# Patient Record
Sex: Female | Born: 1985 | Hispanic: Yes | Smoking: Former smoker
Health system: Southern US, Community
[De-identification: ages and names within clinical notes are randomized; demographics above are authoritative.]

## PROBLEM LIST (undated history)

## (undated) DIAGNOSIS — M2550 Pain in unspecified joint: Secondary | ICD-10-CM

## (undated) DIAGNOSIS — F909 Attention-deficit hyperactivity disorder, unspecified type: Secondary | ICD-10-CM

## (undated) DIAGNOSIS — M24859 Other specific joint derangements of unspecified hip, not elsewhere classified: Secondary | ICD-10-CM

## (undated) DIAGNOSIS — I10 Essential (primary) hypertension: Secondary | ICD-10-CM

## (undated) DIAGNOSIS — J45909 Unspecified asthma, uncomplicated: Secondary | ICD-10-CM

## (undated) DIAGNOSIS — E119 Type 2 diabetes mellitus without complications: Secondary | ICD-10-CM

## (undated) DIAGNOSIS — F32A Depression, unspecified: Secondary | ICD-10-CM

## (undated) DIAGNOSIS — E559 Vitamin D deficiency, unspecified: Secondary | ICD-10-CM

## (undated) HISTORY — DX: Other specific joint derangements of unspecified hip, not elsewhere classified: M24.859

## (undated) HISTORY — DX: Unspecified asthma, uncomplicated: J45.909

## (undated) HISTORY — DX: Pain in unspecified joint: M25.50

## (undated) HISTORY — DX: Essential (primary) hypertension: I10

## (undated) HISTORY — DX: Depression, unspecified: F32.A

## (undated) HISTORY — DX: Type 2 diabetes mellitus without complications: E11.9

## (undated) HISTORY — DX: Vitamin D deficiency, unspecified: E55.9

## (undated) HISTORY — PX: WISDOM TOOTH EXTRACTION: SHX21

## (undated) HISTORY — DX: Attention-deficit hyperactivity disorder, unspecified type: F90.9

---

## 2011-11-27 ENCOUNTER — Ambulatory Visit (INDEPENDENT_AMBULATORY_CARE_PROVIDER_SITE_OTHER): Payer: Managed Care, Other (non HMO)

## 2011-11-27 DIAGNOSIS — IMO0002 Reserved for concepts with insufficient information to code with codable children: Secondary | ICD-10-CM

## 2011-11-27 DIAGNOSIS — M79609 Pain in unspecified limb: Secondary | ICD-10-CM

## 2012-07-01 ENCOUNTER — Emergency Department (HOSPITAL_BASED_OUTPATIENT_CLINIC_OR_DEPARTMENT_OTHER)
Admission: EM | Admit: 2012-07-01 | Discharge: 2012-07-02 | Disposition: A | Discharge status: Home / Self Care | Payer: Managed Care, Other (non HMO) | Attending: Emergency Medicine | Admitting: Emergency Medicine

## 2012-07-01 ENCOUNTER — Emergency Department (HOSPITAL_BASED_OUTPATIENT_CLINIC_OR_DEPARTMENT_OTHER): Payer: Managed Care, Other (non HMO)

## 2012-07-01 ENCOUNTER — Encounter (HOSPITAL_BASED_OUTPATIENT_CLINIC_OR_DEPARTMENT_OTHER): Payer: Self-pay | Admitting: *Deleted

## 2012-07-01 DIAGNOSIS — IMO0002 Reserved for concepts with insufficient information to code with codable children: Secondary | ICD-10-CM | POA: Insufficient documentation

## 2012-07-01 DIAGNOSIS — R0602 Shortness of breath: Secondary | ICD-10-CM | POA: Insufficient documentation

## 2012-07-01 DIAGNOSIS — R071 Chest pain on breathing: Secondary | ICD-10-CM | POA: Insufficient documentation

## 2012-07-01 DIAGNOSIS — X58XXXA Exposure to other specified factors, initial encounter: Secondary | ICD-10-CM | POA: Insufficient documentation

## 2012-07-01 DIAGNOSIS — R079 Chest pain, unspecified: Secondary | ICD-10-CM | POA: Insufficient documentation

## 2012-07-01 DIAGNOSIS — S29011A Strain of muscle and tendon of front wall of thorax, initial encounter: Secondary | ICD-10-CM

## 2012-07-01 DIAGNOSIS — Z87891 Personal history of nicotine dependence: Secondary | ICD-10-CM | POA: Insufficient documentation

## 2012-07-01 LAB — URINALYSIS, ROUTINE W REFLEX MICROSCOPIC
Bilirubin Urine: NEGATIVE
Hgb urine dipstick: NEGATIVE
Ketones, ur: NEGATIVE mg/dL
Nitrite: NEGATIVE
Specific Gravity, Urine: 1.026 (ref 1.005–1.030)
pH: 7.5 (ref 5.0–8.0)

## 2012-07-01 LAB — BASIC METABOLIC PANEL
BUN: 10 mg/dL (ref 6–23)
CO2: 24 mEq/L (ref 19–32)
Calcium: 10 mg/dL (ref 8.4–10.5)
GFR calc non Af Amer: >90 mL/min (ref >90)
Glucose, Bld: 84 mg/dL (ref 70–99)
Sodium: 137 mEq/L (ref 135–145)

## 2012-07-01 LAB — CBC WITH DIFFERENTIAL/PLATELET
Eosinophils Absolute: 0.2 10*3/uL (ref 0.0–0.7)
Eosinophils Relative: 2 % (ref 0–5)
HCT: 37.4 % (ref 36.0–46.0)
Hemoglobin: 13 g/dL (ref 12.0–15.0)
Lymphocytes Relative: 30 % (ref 12–46)
Lymphs Abs: 3.3 10*3/uL (ref 0.7–4.0)
MCH: 28.1 pg (ref 26.0–34.0)
MCV: 81 fL (ref 78.0–100.0)
Monocytes Absolute: 0.7 10*3/uL (ref 0.1–1.0)
Monocytes Relative: 7 % (ref 3–12)
Platelets: 264 10*3/uL (ref 150–400)
RBC: 4.62 MIL/uL (ref 3.87–5.11)
WBC: 11 10*3/uL — ABNORMAL HIGH (ref 4.0–10.5)

## 2012-07-01 LAB — PREGNANCY, URINE: Preg Test, Ur: NEGATIVE

## 2012-07-01 MED ORDER — KETOROLAC TROMETHAMINE 30 MG/ML IJ SOLN
30.0000 mg | Freq: Once | INTRAMUSCULAR | Status: AC
  Administered 2012-07-01: 30 mg via INTRAVENOUS
  Filled 2012-07-01: qty 1

## 2012-07-01 MED ORDER — ALBUTEROL SULFATE HFA 108 (90 BASE) MCG/ACT IN AERS
2.0000 | INHALATION_SPRAY | RESPIRATORY_TRACT | Status: DC | PRN
  Administered 2012-07-01: 2 via RESPIRATORY_TRACT
  Filled 2012-07-01: qty 6.7

## 2012-07-01 MED ORDER — IOHEXOL 350 MG/ML SOLN
80.0000 mL | Freq: Once | INTRAVENOUS | Status: AC | PRN
  Administered 2012-07-01: 80 mL via INTRAVENOUS

## 2012-07-01 NOTE — ED Provider Notes (Signed)
History     CSN: [id number]  Arrival date & time 07/01/12  1940   First MD Initiated Contact with Patient 07/01/12 2010      Chief Complaint  Patient presents with  . Chest Pain    (Consider location/radiation/quality/duration/timing/severity/associated sxs/prior treatment) HPI Patient is a 26 year old female who presents today complaining of chest discomfort that is worse with movement and taking a deep breath. She's noted this over the past 2-3 days. She denies any acute injuries. She reports that the pain radiates into her back. Patient denies any specific injuries. She denies any cough or fevers. She has used an albuterol inhaler in the past but not for long time. Patient has no history of blood clots in her legs or lungs. She's not on oral contraceptives and does not smoke. She has no history of diabetes or hypertension. Patient has tried over-the-counter medications without relief her symptoms. This has included NSAIDs as well as medications for reflux. There no other associated or modifying factors. Pain is described as bilateral and substernal pressure. She rates this as a 5/10. History reviewed. No pertinent past medical history.  Past Surgical History  Procedure Date  . Wisdom tooth extraction     History reviewed. No pertinent family history.  History  Substance Use Topics  . Smoking status: Former Games developer  . Smokeless tobacco: Not on file  . Alcohol Use: Yes    OB History    Grav Para Term Preterm Abortions TAB SAB Ect Mult Living                  Review of Systems  Constitutional: Negative.   HENT: Negative.   Eyes: Negative.   Respiratory: Positive for chest tightness and shortness of breath.   Cardiovascular: Positive for chest pain.  Gastrointestinal: Negative.   Genitourinary: Negative.   Musculoskeletal: Negative.   Skin: Negative.   Neurological: Negative.   Hematological: Negative.   Psychiatric/Behavioral: Negative.   All other systems reviewed  and are negative.    Allergies  Review of patient's allergies indicates no known allergies.  Home Medications  No current outpatient prescriptions on file.  BP 147/78  Pulse 70  Temp 97.6 F (36.4 C) (Oral)  Resp 18  Ht 5\' 2"  (1.575 m)  Wt 206 lb (93.441 kg)  BMI 37.68 kg/m2  SpO2 98%  LMP 06/17/2012  Physical Exam  Nursing note and vitals reviewed. GEN: Well-developed, well-nourished female in no distress HEENT: Atraumatic, normocephalic.  EYES: PERRLA BL, no scleral icterus. NECK: Trachea midline, no meningismus CV: regular rate and rhythm. No murmurs, rubs, or gallops PULM: No respiratory distress.  No crackles, wheezes, or rales. GI: soft, non-tender. No guarding, rebound, or tenderness. + bowel sounds  GU: deferred Neuro: cranial nerves grossly 2-12 intact, no abnormalities of strength or sensation, A and O x 3 MSK: Patient moves all 4 extremities symmetrically, no deformity, edema, or injury noted Skin: No rashes petechiae, purpura, or jaundice Psych: no abnormality of mood   ED Course  Procedures (including critical care time)  Indication: Chest pain Please note this EKG was reviewed extemporaneously by myself.  Date: 07/01/2012  Rate: 66  Rhythm: normal sinus rhythm and sinus arrhythmia  QRS Axis: normal  Intervals: normal  ST/T Wave abnormalities: normal  Conduction Disutrbances:none  Narrative Interpretation:   Old EKG Reviewed: none available     Labs Reviewed  D-DIMER, QUANTITATIVE - Abnormal; Notable for the following:    D-Dimer, Quant 0.54 (*)     All  other components within normal limits  CBC WITH DIFFERENTIAL - Abnormal; Notable for the following:    WBC 11.0 (*)     All other components within normal limits  PREGNANCY, URINE  URINALYSIS, ROUTINE W REFLEX MICROSCOPIC  BASIC METABOLIC PANEL   Dg Chest 2 View  07/01/2012  *RADIOLOGY REPORT*  Clinical Data: Upper chest pain, shortness of breath, former smoker  CHEST - 2 VIEW   Comparison: None  Findings: Normal heart size, mediastinal contours, and pulmonary vascularity. Lungs clear. Bones unremarkable. No pneumothorax.  IMPRESSION: Normal exam.   Original Report Authenticated By: Lollie Marrow, M.D.    Ct Angio Chest Pe W/cm &/or Wo Cm  07/01/2012  *RADIOLOGY REPORT*  Clinical Data: Elevated D-dimer.  Right-sided chest pain. Pleuritic component of chest pain.  CT ANGIOGRAPHY CHEST  Technique:  Multidetector CT imaging of the chest using the standard protocol during bolus administration of intravenous contrast. Multiplanar reconstructed images including MIPs were obtained and reviewed to evaluate the vascular anatomy.  Contrast: 80mL OMNIPAQUE IOHEXOL 350 MG/ML SOLN  Comparison: None.  Findings: The pulmonary arteries are adequately opacified.  There is no evidence of pulmonary embolism.  The thoracic aorta is also well opacified and shows normal caliber and appearance.  Lungs are clear without edema, focal infiltrate or nodule.  No pleural or pericardial fluid is seen.  The heart size is within normal limits.  No enlarged lymph nodes.  Normal appearing thymic remnant tissue in the anterior mediastinum.  The visualized upper abdomen is unremarkable.  No bony abnormalities are seen.  IMPRESSION: No evidence of pulmonary embolism or other acute abnormalities in the chest.   Original Report Authenticated By: Reola Calkins, M.D.      1. Chest wall muscle strain       MDM  Patient was evaluated by myself. Based on evaluation patient workup for possible causes of her chest pain. EKG was unremarkable and chest x-ray was clear. As patient is demonstrating female I did check for any signs of anemia and this was negative. Renal panel showed no signs of dehydration. Patient did describe pain that was worse the perforation and a d-dimer was performed. This was elevated. CT injury the chest showed absolutely no pathology. Patient continued to have symptoms. She already treated herself  without resolution with medications for reflux. We then attempted treating her with 2 puffs of albuterol which did not make a difference. Patient did admit that though she cannot recall acute injury she had been working hard and she had increased lifting of boxes at work. At this time patient's injuries are thought to be secondary to heavy lifting. She likely has a chest wall muscle strain. Patient was given a dose of Toradol and was advised to use ice and anti-inflammatories her symptoms at home. She was discharged in good condition.        Cyndra Numbers, MD 07/02/12 [phone]

## 2012-07-01 NOTE — ED Notes (Signed)
Pt reports right sided chest pains x 2-3days. SOB began today. States the pain radiates into her back. Worsens with deep breathing and movement. 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 Denies recent illness or cough.

## 2012-07-01 NOTE — Patient Instructions (Signed)
Instructed pt on the proper use of administering albuteral mdi via aerochamber

## 2017-06-10 DIAGNOSIS — J069 Acute upper respiratory infection, unspecified: Secondary | ICD-10-CM | POA: Diagnosis not present

## 2017-08-18 DIAGNOSIS — Z23 Encounter for immunization: Secondary | ICD-10-CM | POA: Diagnosis not present

## 2018-02-16 ENCOUNTER — Ambulatory Visit: Payer: Self-pay

## 2018-02-16 ENCOUNTER — Ambulatory Visit (INDEPENDENT_AMBULATORY_CARE_PROVIDER_SITE_OTHER)
Admission: RE | Admit: 2018-02-16 | Discharge: 2018-02-16 | Disposition: A | Discharge status: Home / Self Care | Payer: BLUE CROSS/BLUE SHIELD | Source: Ambulatory Visit | Attending: Family Medicine | Admitting: Family Medicine

## 2018-02-16 ENCOUNTER — Ambulatory Visit: Payer: BLUE CROSS/BLUE SHIELD | Admitting: Family Medicine

## 2018-02-16 VITALS — BP 122/90 | HR 84 | Ht 62.0 in | Wt 210.0 lb

## 2018-02-16 DIAGNOSIS — M7551 Bursitis of right shoulder: Secondary | ICD-10-CM | POA: Insufficient documentation

## 2018-02-16 DIAGNOSIS — M25511 Pain in right shoulder: Principal | ICD-10-CM

## 2018-02-16 DIAGNOSIS — G8929 Other chronic pain: Secondary | ICD-10-CM

## 2018-02-16 MED ORDER — DICLOFENAC SODIUM 2 % TD SOLN: 2.0000 g | Freq: Two times a day (BID) | TRANSDERMAL | 3 refills | Status: AC

## 2018-02-16 NOTE — Patient Instructions (Signed)
Good to see you.  Ice 20 minutes 2 times daily. Usually after activity and before bed. Exercises 3 times a week.  pennsaid pinkie amount topically 2 times daily as needed.  Keep hand swithin peripheral visoin when you can  Tried injections today  See me again in 4 weeks to make sure you are better

## 2018-02-16 NOTE — Progress Notes (Signed)
[facility] [address] [place], [place] [address] Phone: [phone] Subjective:    I'm seeing this patient by the request  of:    CC: Right shoulder pain  BJY:NWGNFAOZHY  Anne Singh is a 32 y.o. female coming in with complaint of right shoulder pain for one year. Pain radiates down into her arm but starts in the anterior shoulder. History of Grade 1 AC separation. Pain is constant. Pain is alleviated with sling position. Numbness does occur in the 4th and 5th intermittently. Pain with flexion and internal rotation. Pain is dull and is sharp with movement. IBU has been using as well as Voltaren but these help minimally.        No past medical history on file. Past Surgical History:  Procedure Laterality Date  . WISDOM TOOTH EXTRACTION     Social History   Socioeconomic History  . Marital status: Single    Spouse name: Not on file  . Number of children: Not on file  . Years of education: Not on file  . Highest education level: Not on file  Occupational History  . Not on file  Social Needs  . Financial resource strain: Not on file  . Food insecurity:    Worry: Not on file    Inability: Not on file  . Transportation needs:    Medical: Not on file    Non-medical: Not on file  Tobacco Use  . Smoking status: Former Smoker  Substance and Sexual Activity  . Alcohol use: Yes  . Drug use: No  . Sexual activity: Yes    Birth control/protection: None  Lifestyle  . Physical activity:    Days per week: Not on file    Minutes per session: Not on file  . Stress: Not on file  Relationships  . Social connections:    Talks on phone: Not on file    Gets together: Not on file    Attends religious service: Not on file    Active member of club or organization: Not on file    Attends meetings of clubs or organizations: Not on file    Relationship status: Not on file  Other Topics Concern  . Not on file  Social History Narrative  . Not on file     No Known Allergies No family history on file.  No autoimmune disease   Past medical history, social, surgical and family history all reviewed in electronic medical record.  No pertanent information unless stated regarding to the chief complaint.   Review of Systems:Review of systems updated and as accurate as of 02/16/18  No headache, visual changes, nausea, vomiting, diarrhea, constipation, dizziness, abdominal pain, skin rash, fevers, chills, night sweats, weight loss, swollen lymph nodes, body aches, joint swelling,  chest pain, shortness of breath, mood changes.  Positive muscle aches  Objective  Blood pressure 122/90, pulse 84, height 5\' 2"  (1.575 m), weight 210 lb (95.3 kg), last menstrual period 01/20/2018, SpO2 98 %. Systems examined below as of 02/16/18   General: No apparent distress alert and oriented x3 mood and affect normal, dressed appropriately.  HEENT: Pupils equal, extraocular movements intact  Respiratory: Patient's speak in full sentences and does not appear short of breath  Cardiovascular: No lower extremity edema, non tender, no erythema  Skin: Warm dry intact with no signs of infection or rash on extremities or on axial skeleton.  Abdomen: Soft nontender  Neuro: Cranial nerves II through XII are intact, neurovascularly intact  in all extremities with 2+ DTRs and 2+ pulses.  Lymph: No lymphadenopathy of posterior or anterior cervical chain or axillae bilaterally.  Gait normal with good balance and coordination.  MSK:  Non tender with full range of motion and good stability and symmetric strength and tone of  elbows, wrist, hip, knee and ankles bilaterally.  Shoulder: Right Inspection reveals no abnormalities, atrophy or asymmetry. Palpation is normal with no tenderness over AC joint or bicipital groove. ROM is full in all planes passively. Rotator cuff strength normal throughout. signs of impingement with positive Neer and Hawkin's tests, but negative empty can  sign. Speeds and Yergason's tests normal. No labral pathology noted with negative Obrien's, negative clunk and good stability. Normal scapular function observed. No painful arc and no drop arm sign. No apprehension sign Contralateral shoulder unremarkable  MSK US performed of: Right This study was ordered, performed, and interpreted by Terrilee FilesZach Riordan Walle D.O.  Shoulder:   Supraspinatus:  Appears normal on long and transverse views, Bursal bulge seen with shoulder abduction on impingement view. Infraspinatus:  Appears normal on long and transverse views. Significant increase in Doppler flow Subscapularis:  Appears normal on long and transverse views. Positive bursa questionable cyst inferior or deep to the pectoralis muscle Teres Minor:  Appears normal on long and transverse views. AC joint:  Capsule undistended, no geyser sign. Glenohumeral Joint:  Appears normal without effusion. Glenoid Labrum:  Intact without visualized tears. Biceps Tendon:  Appears normal on long and transverse views, no fraying of tendon, tendon located in intertubercular groove, no subluxation with shoulder internal or external rotation.  Impression: Subacromial bursitis with possible anterior cyst noted.  Procedure: Real-time Ultrasound Guided Injection of right glenohumeral joint Device: GE Logiq E  Ultrasound guided injection is preferred based studies that show increased duration, increased effect, greater accuracy, decreased procedural pain, increased response rate with ultrasound guided versus blind injection.  Verbal informed consent obtained.  Time-out conducted.  Noted no overlying erythema, induration, or other signs of local infection.  Skin prepped in a sterile fashion.  Local anesthesia: Topical Ethyl chloride.  With sterile technique and under real time ultrasound guidance:  Joint visualized.  23g 1  inch needle inserted posterior approach. Pictures taken for needle placement. Patient did have injection  of 2 cc of 1% lidocaine, 2 cc of 0.5% Marcaine, and 1.0 cc of Kenalog 40 mg/dL. Completed without difficulty  Pain immediately resolved suggesting accurate placement of the medication.  Advised to call if fevers/chills, erythema, induration, drainage, or persistent bleeding.  Images permanently stored and available for review in the ultrasound unit.  Impression: Technically successful ultrasound guided injection.    [id number]; 15 additional minutes spent for Therapeutic exercises as stated in above notes.  This included exercises focusing on stretching, strengthening, with significant focus on eccentric aspects.   Long term goals include an improvement in range of motion, strength, endurance as well as avoiding reinjury. Patient's frequency would include in 1-2 times a day, 3-5 times a week for a duration of 6-12 weeks. Shoulder Exercises that included:  Basic scapular stabilization to include adduction and depression of scapula Scaption, focusing on proper movement and good control Internal and External rotation utilizing a theraband, with elbow tucked at side entire time Rows with theraband which was givne   Proper technique shown and discussed handout in great detail with ATC.  All questions were discussed and answered.   Impression and Recommendations:     This case required medical decision making of moderate complexity.  Note: This dictation was prepared with Dragon dictation along with smaller phrase technology. Any transcriptional errors that result from this process are unintentional.

## 2018-02-16 NOTE — Assessment & Plan Note (Signed)
Chronic shoulder bursitis.  There is does seem to be a possible anterior cyst just deep to the pectoralis muscle near the coracoid process.  We may need to consider aspiration or attempted aspiration at that time.  We discussed the possibility of advanced imaging as well.  X-rays ordered.  Discussed icing regimen.  Discussed topical anti-inflammatories and home exercises.  Follow-up again in 4 weeks

## 2018-03-16 ENCOUNTER — Ambulatory Visit: Payer: BLUE CROSS/BLUE SHIELD | Admitting: Family Medicine

## 2018-04-05 ENCOUNTER — Encounter: Payer: Self-pay | Admitting: Family Medicine

## 2018-04-05 ENCOUNTER — Ambulatory Visit: Payer: BLUE CROSS/BLUE SHIELD | Admitting: Family Medicine

## 2018-04-05 DIAGNOSIS — M7551 Bursitis of right shoulder: Secondary | ICD-10-CM | POA: Diagnosis not present

## 2018-04-05 NOTE — Patient Instructions (Signed)
Good to see you  Hands within peripheral vision  Ice is your friend.  Compression sleeve with working out If not 100% better see me again in 6 weeks

## 2018-04-05 NOTE — Progress Notes (Signed)
[facility] [address] [place], [place] [address] Phone: [phone] Subjective:     CC: Right shoulder pain follow-up  BJY:NWGNFAOZHY  Anne Singh is a 32 y.o. female coming in with complaint of right shoulder pain.  Seem to be more of a right shoulder bursitis as well as a anterior cyst.  Patient was given an injection previously.  This was 1 month ago.  Feeling significantly better.  States 90% better.  Still has some mild impingement from time to time but nothing severe.  Doing the exercises regularly.  Denies any significant radiation down the arm.  Nothing that stops her from activity.     No past medical history on file. Past Surgical History:  Procedure Laterality Date  . WISDOM TOOTH EXTRACTION     Social History   Socioeconomic History  . Marital status: Single    Spouse name: Not on file  . Number of children: Not on file  . Years of education: Not on file  . Highest education level: Not on file  Occupational History  . Not on file  Social Needs  . Financial resource strain: Not on file  . Food insecurity:    Worry: Not on file    Inability: Not on file  . Transportation needs:    Medical: Not on file    Non-medical: Not on file  Tobacco Use  . Smoking status: Former Smoker  Substance and Sexual Activity  . Alcohol use: Yes  . Drug use: No  . Sexual activity: Yes    Birth control/protection: None  Lifestyle  . Physical activity:    Days per week: Not on file    Minutes per session: Not on file  . Stress: Not on file  Relationships  . Social connections:    Talks on phone: Not on file    Gets together: Not on file    Attends religious service: Not on file    Active member of club or organization: Not on file    Attends meetings of clubs or organizations: Not on file    Relationship status: Not on file  Other Topics Concern  . Not on file  Social History Narrative  . Not on file   No Known Allergies No family  history on file.   Past medical history, social, surgical and family history all reviewed in electronic medical record.  No pertanent information unless stated regarding to the chief complaint.   Review of Systems:Review of systems updated and as accurate as of 04/05/18  No headache, visual changes, nausea, vomiting, diarrhea, constipation, dizziness, abdominal pain, skin rash, fevers, chills, night sweats, weight loss, swollen lymph nodes, body aches, joint swelling, muscle aches, chest pain, shortness of breath, mood changes.   Objective  Blood pressure 140/82, pulse 98, height 5\' 2"  (1.575 m), weight 208 lb (94.3 kg), SpO2 98 %. Systems examined below as of 04/05/18   General: No apparent distress alert and oriented x3 mood and affect normal, dressed appropriately.  HEENT: Pupils equal, extraocular movements intact  Respiratory: Patient's speak in full sentences and does not appear short of breath  Cardiovascular: No lower extremity edema, non tender, no erythema  Skin: Warm dry intact with no signs of infection or rash on extremities or on axial skeleton.  Abdomen: Soft nontender  Neuro: Cranial nerves II through XII are intact, neurovascularly intact in all extremities with 2+ DTRs and 2+ pulses.  Lymph: No lymphadenopathy of posterior or anterior cervical chain  or axillae bilaterally.  Gait normal with good balance and coordination.  MSK:  Non tender with full range of motion and good stability and symmetric strength and tone of elbows, wrist, hip, knee and ankles bilaterally.  Shoulder: Right Inspection reveals no abnormalities, atrophy or asymmetry. Palpation is normal with no tenderness over AC joint or bicipital groove. ROM is full in all planes. Rotator cuff strength normal throughout. Positive impingement Speeds and Yergason's tests normal. Mild positive O'Brien's Normal scapular function observed. Mild painful arc No apprehension sign Contralateral shoulder  unremarkable      Impression and Recommendations:     This case required medical decision making of moderate complexity.      Note: This dictation was prepared with Dragon dictation along with smaller phrase technology. Any transcriptional errors that result from this process are unintentional.

## 2018-04-05 NOTE — Assessment & Plan Note (Signed)
Still some mild impingement symptoms still remaining.  Discussed icing regimen and home exercises.  Discussed which activities to do which wants to avoid.  Patient is to increase activity slowly over the course next several days.  Follow-up again 4 to 6 weeks if pain not resolved.

## 2018-08-24 DIAGNOSIS — Z23 Encounter for immunization: Secondary | ICD-10-CM | POA: Diagnosis not present

## 2018-11-14 NOTE — Progress Notes (Signed)
Tawana ScaleZach Vivi Singh D.O. [facility] [address] [place], [place] [address] Phone: [phone] Subjective:   Anne Singh, Anne Singh, am serving as a scribe for Dr. Antoine PrimasZachary Nihal Singh.   CC: Toe pain  BJY:[id number]:Subjective  Anne Singh is a 33 y.o. female coming in with complaint of left foot pain over the great toe. She said that she has had pain for one week. Last week got out of bed and had pain in the great toe where she was unable to walk. Pain improves with wearing a shoe. Has been icing and using IBU. Has not been working out.       No past medical history on file. Past Surgical History:  Procedure Laterality Date  . WISDOM TOOTH EXTRACTION     Social History   Socioeconomic History  . 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 Number of children: Not on file  . Years of education: Not on file  . Highest education level: Not on file  Occupational History  . Not on file  Social Needs  . Financial resource strain: Not on file  . Food insecurity:    Worry: Not on file    Inability: Not on file  . Transportation needs:    Medical: Not on file    Non-medical: Not on file  Tobacco Use  . Smoking status: Former Smoker  Substance and Sexual Activity  . Alcohol use: Yes  . Drug use: No  . Sexual activity: Yes    Birth control/protection: None  Lifestyle  . Physical activity:    Days per week: Not on file    Minutes per session: Not on file  . Stress: Not on file  Relationships  . Social connections:    Talks on phone: Not on file    Gets together: Not on file    Attends religious service: Not on file    Active member of club or organization: Not on file    Attends meetings of clubs or organizations: Not on file    Relationship status: Not on file  Other Topics Concern  . Not on file  Social History Narrative  . Not on file   No Known Allergies No family history on file.       Current Outpatient Medications (Other):  Marland Kitchen.  Diclofenac Sodium  2 % SOLN, Place 2 g onto the skin 2 (two) times daily.    Past medical history, social, surgical and family history all reviewed in electronic medical record.  No pertanent information unless stated regarding to the chief complaint.   Review of Systems:  No headache, visual changes, nausea, vomiting, diarrhea, constipation, dizziness, abdominal pain, skin rash, fevers, chills, night sweats, weight loss, swollen lymph nodes, body aches, joint swelling, muscle aches, chest pain, shortness of breath, mood changes.   Objective  There were no vitals taken for this visit. Systems examined below as of    General: No apparent distress alert and oriented x3 mood and affect normal, dressed appropriately.  HEENT: Pupils equal, extraocular movements intact  Respiratory: Patient's speak in full sentences and does not appear short of breath  Cardiovascular: No lower extremity edema, non tender, no erythema  Skin: Warm dry intact with no signs of infection or rash on extremities or on axial skeleton.  Abdomen: Soft nontender  Neuro: Cranial nerves II through XII are intact, neurovascularly intact in all extremities with 2+ DTRs and 2+ pulses.  Lymph: No lymphadenopathy of posterior or  anterior cervical chain or axillae bilaterally.  Gait normal with good balance and coordination.  MSK:  Non tender with full range of motion and good stability and symmetric strength and tone of shoulders, elbows, wrist, hip, knee and ankles bilaterally.  Left foot exam shows severe breakdown of the transverse arch between toes on the left foot.  Splaying between the first and second toes noted.  Mild pain on the ball of the foot.  Patient's longitudinal arch seems to be intact though.  Full range of motion of the ankle  Limited musculoskeletal ultrasound was performed and interpreted by Anne Singh  Limited ultrasound of patient's foot shows the patient does have a severe synovitis of the first MTP.  No neuroma noted.   Patient though does have what appears to be a very small mild bursitis and a healing of a sesamoid fracture noted on the plantar aspect of the first toe. Impression: Synovitis possible healing sesamoid fracture  97110; 15 additional minutes spent for Therapeutic exercises as stated in above notes.  This included exercises focusing on stretching, strengthening, with significant focus on eccentric aspects.   Long term goals include an improvement in range of motion, strength, endurance as well as avoiding reinjury. Patient's frequency would include in 1-2 times a day, 3-5 times a week for a duration of 6-12 weeks. Exercises for the foot include:  Stretches to help lengthen the lower leg and plantar fascia areas Theraband exercises for the lower leg and ankle to help strengthen the surrounding area- dorsiflexion, plantarflexion, inversion, eversion Massage rolling on the plantar surface of the foot with a frozen bottle, tennis ball or golf ball Towel or marble pick-ups to strengthen the plantar surface of the foot Weight bearing exercises to increase balance and overall stability    Proper technique shown and discussed handout in great detail with ATC.  All questions were discussed and answered.      Impression and Recommendations:     This case required medical decision making of moderate complexity. The above documentation has been reviewed and is accurate and complete Anne SaaZachary M Kaelen Brennan, DO       Note: This dictation was prepared with Dragon dictation along with smaller phrase technology. Any transcriptional errors that result from this process are unintentional.

## 2018-11-15 ENCOUNTER — Ambulatory Visit: Payer: BLUE CROSS/BLUE SHIELD | Admitting: Family Medicine

## 2018-11-15 ENCOUNTER — Ambulatory Visit: Payer: Self-pay

## 2018-11-15 VITALS — BP 102/68 | HR 98 | Ht 62.0 in | Wt 202.0 lb

## 2018-11-15 DIAGNOSIS — M79672 Pain in left foot: Secondary | ICD-10-CM

## 2018-11-15 DIAGNOSIS — M659 Synovitis and tenosynovitis, unspecified: Secondary | ICD-10-CM

## 2018-11-15 NOTE — Assessment & Plan Note (Signed)
Mostly synovitis in the toe.  Discussed topical anti-inflammatories, home exercise, over-the-counter orthotics and proper lacing of the shoes.  We discussed icing regimen and home exercises again in greater detail.  Patient will avoid putting no the orthotic in her skates.  Follow-up with me again in 4 to 6 weeks

## 2018-11-15 NOTE — Patient Instructions (Signed)
Great to see you  Mild plantar bursitis secondary to healing sesamoid fracture and a synovitis of the first toes secondary to breakdown of the transverse arch Vitamin D 4000 IU daily over the counter for the next month  Avoid being barefoot Spenco orthotics "total support" online would be great  Do not lace last eye on the toe  Exercises 3 times a week.  See me agai nin 4-5 weeks to make sure you are doing great

## 2018-11-28 ENCOUNTER — Encounter: Payer: Self-pay | Admitting: Family Medicine

## 2018-12-18 NOTE — Progress Notes (Signed)
Anne Singh [address] [place], [place] [address] Phone: [phone] Subjective:    I Anne Singh am serving as a Neurosurgeon for Dr. Antoine Primas.   I'm seeing this patient by the request  of:    CC: Foot pain follow-up  JOI:[id number]    11/15/2018 Mostly synovitis in the toe.  Discussed topical anti-inflammatories, home exercise, over-the-counter orthotics and proper lacing of the shoes.  We discussed icing regimen and home exercises again in greater detail.  Patient will avoid putting no the orthotic in her skates.  Follow-up with me again in 4 to 6 weeks  Updated 12/19/2018 Anne Singh is a 33 y.o. female coming in with complaint of left foot pain. Patient states the foot is doing well.  Patient was found to have more of a synovitis as well as maybe a mild sesamoid fracture initially.  Has been doing much better overall though.  Patient states that minimal pain to any pain at the moment.  Patient is just concerned about, she is going to continue to give some discomfort.      No past medical history on file. Past Surgical History:  Procedure Laterality Date  . WISDOM TOOTH EXTRACTION     Social History   Socioeconomic History  . Marital status: Media planner    Spouse name: Not on file  . Number of children: Not on file  . Years of education: Not on file  . Highest education level: Not on file  Occupational History  . Not on file  Social Needs  . Financial resource strain: Not on file  . Food insecurity:    Worry: Not on file    Inability: Not on file  . Transportation needs:    Medical: Not on file    Non-medical: Not on file  Tobacco Use  . Smoking status: Former Smoker  Substance and Sexual Activity  . Alcohol use: Yes  . Drug use: No  . Sexual activity: Yes    Birth control/protection: None  Lifestyle  . Physical activity:    Days per week: Not on file    Minutes per session: Not on file  . Stress: Not on file    Relationships  . Social connections:    Talks on phone: Not on file    Gets together: Not on file    Attends religious service: Not on file    Active member of club or organization: Not on file    Attends meetings of clubs or organizations: Not on file    Relationship status: Not on file  Other Topics Concern  . Not on file  Social History Narrative  . Not on file   No Known Allergies No family history on file.       Current Outpatient Medications (Other):  Marland Kitchen  Diclofenac Sodium 2 % SOLN, Place 2 g onto the skin 2 (two) times daily.    Past medical history, social, surgical and family history all reviewed in electronic medical record.  No pertanent information unless stated regarding to the chief complaint.   Review of Systems:  No headache, visual changes, nausea, vomiting, diarrhea, constipation, dizziness, abdominal pain, skin rash, fevers, chills, night sweats, weight loss, swollen lymph nodes, body aches, joint swelling, muscle aches, chest pain, shortness of breath, mood changes.   Objective  There were no vitals taken for this visit. Systems examined below as of    General: No apparent distress alert and oriented x3 mood and affect  normal, dressed appropriately.  HEENT: Pupils equal, extraocular movements intact  Respiratory: Patient's speak in full sentences and does not appear short of breath  Cardiovascular: No lower extremity edema, non tender, no erythema  Skin: Warm dry intact with no signs of infection or rash on extremities or on axial skeleton.  Abdomen: Soft nontender  Neuro: Cranial nerves II through XII are intact, neurovascularly intact in all extremities with 2+ DTRs and 2+ pulses.  Lymph: No lymphadenopathy of posterior or anterior cervical chain or axillae bilaterally.  Gait normal with good balance and coordination.  MSK:  Non tender with full range of motion and good stability and symmetric strength and tone of shoulders, elbows, wrist, hip, knee  and ankles bilaterally.  Foot exam does show some mild breakdown of the longitudinal and transverse arch.  Patient has full range of motion of the first and TP at this moment.  Nontender.  Negative squeeze test   Impression and Recommendations:     This case required medical decision making of moderate complexitThe above documentation has been reviewed and is accurate and complete Judi Saa, DO       Note: This dictation was prepared with Dragon dictation along with smaller phrase technology. Any transcriptional errors that result from this process are unintentional.

## 2018-12-19 ENCOUNTER — Ambulatory Visit: Payer: BLUE CROSS/BLUE SHIELD | Admitting: Family Medicine

## 2018-12-19 DIAGNOSIS — M659 Synovitis and tenosynovitis, unspecified: Secondary | ICD-10-CM

## 2018-12-19 DIAGNOSIS — R5381 Other malaise: Secondary | ICD-10-CM | POA: Diagnosis not present

## 2018-12-19 DIAGNOSIS — Z87898 Personal history of other specified conditions: Secondary | ICD-10-CM | POA: Diagnosis not present

## 2018-12-19 DIAGNOSIS — F331 Major depressive disorder, recurrent, moderate: Secondary | ICD-10-CM | POA: Diagnosis not present

## 2018-12-19 DIAGNOSIS — Z8249 Family history of ischemic heart disease and other diseases of the circulatory system: Secondary | ICD-10-CM | POA: Diagnosis not present

## 2018-12-19 DIAGNOSIS — R5383 Other fatigue: Secondary | ICD-10-CM | POA: Diagnosis not present

## 2018-12-19 NOTE — Patient Instructions (Signed)
Good to see you  Ice is your friend Spenco orthotics "total support" online would be great  Avoid a lot of barefoot  Glute exercises  See me again in 4 weeks if the pain in the butt is not gone.

## 2018-12-19 NOTE — Assessment & Plan Note (Signed)
Patient doing much better.  No significant change in management.  Patient is come fully cleared and will follow-up as needed

## 2018-12-29 DIAGNOSIS — H1011 Acute atopic conjunctivitis, right eye: Secondary | ICD-10-CM | POA: Diagnosis not present

## 2019-01-18 DIAGNOSIS — Z713 Dietary counseling and surveillance: Secondary | ICD-10-CM | POA: Diagnosis not present

## 2019-02-02 DIAGNOSIS — Z713 Dietary counseling and surveillance: Secondary | ICD-10-CM | POA: Diagnosis not present

## 2019-02-22 DIAGNOSIS — E119 Type 2 diabetes mellitus without complications: Secondary | ICD-10-CM | POA: Diagnosis not present

## 2019-02-22 DIAGNOSIS — F331 Major depressive disorder, recurrent, moderate: Secondary | ICD-10-CM | POA: Diagnosis not present

## 2019-03-22 DIAGNOSIS — E119 Type 2 diabetes mellitus without complications: Secondary | ICD-10-CM | POA: Diagnosis not present

## 2019-03-28 ENCOUNTER — Encounter: Payer: Self-pay | Admitting: Family Medicine

## 2019-04-03 DIAGNOSIS — F331 Major depressive disorder, recurrent, moderate: Secondary | ICD-10-CM | POA: Diagnosis not present

## 2019-04-03 DIAGNOSIS — F909 Attention-deficit hyperactivity disorder, unspecified type: Secondary | ICD-10-CM | POA: Diagnosis not present

## 2019-04-03 DIAGNOSIS — F5101 Primary insomnia: Secondary | ICD-10-CM | POA: Diagnosis not present

## 2019-04-03 DIAGNOSIS — E119 Type 2 diabetes mellitus without complications: Secondary | ICD-10-CM | POA: Diagnosis not present

## 2019-04-17 ENCOUNTER — Other Ambulatory Visit: Payer: Self-pay | Admitting: Family Medicine

## 2019-04-17 ENCOUNTER — Ambulatory Visit (INDEPENDENT_AMBULATORY_CARE_PROVIDER_SITE_OTHER)
Admission: RE | Admit: 2019-04-17 | Discharge: 2019-04-17 | Disposition: A | Discharge status: Home / Self Care | Payer: BC Managed Care – PPO | Source: Ambulatory Visit | Attending: Family Medicine | Admitting: Family Medicine

## 2019-04-17 ENCOUNTER — Encounter: Payer: Self-pay | Admitting: Family Medicine

## 2019-04-17 ENCOUNTER — Ambulatory Visit: Payer: BLUE CROSS/BLUE SHIELD | Admitting: Family Medicine

## 2019-04-17 ENCOUNTER — Other Ambulatory Visit: Payer: Self-pay

## 2019-04-17 VITALS — BP 116/76 | HR 89 | Ht 62.0 in | Wt 168.0 lb

## 2019-04-17 DIAGNOSIS — M24851 Other specific joint derangements of right hip, not elsewhere classified: Secondary | ICD-10-CM | POA: Diagnosis not present

## 2019-04-17 DIAGNOSIS — E119 Type 2 diabetes mellitus without complications: Secondary | ICD-10-CM | POA: Diagnosis not present

## 2019-04-17 DIAGNOSIS — Z713 Dietary counseling and surveillance: Secondary | ICD-10-CM | POA: Diagnosis not present

## 2019-04-17 DIAGNOSIS — M79609 Pain in unspecified limb: Secondary | ICD-10-CM

## 2019-04-17 DIAGNOSIS — M25551 Pain in right hip: Secondary | ICD-10-CM

## 2019-04-17 NOTE — Patient Instructions (Addendum)
You shoud ldo well overall  DHEA 50 mg daily for 4 weeks Exercises 3 times a week.   Ice 20 minutes 2 times daily. Usually after activity and before bed. Xrays downstairs See me agai nin 4-6 weeks

## 2019-04-17 NOTE — Assessment & Plan Note (Signed)
Snapping hip.  Discussed posture and ergonomics.  We discussed weight loss, we discussed compression.  Follow-up again in 4 to 6 weeks

## 2019-04-17 NOTE — Assessment & Plan Note (Signed)
Popliteal pain.  Discussed which activities of doing which wants to avoid.  We discussed with patient about compression and how this can be beneficial.  Discussed the importance of hamstring strengthening.  Follow-up again in 4 to 6 weeks

## 2019-04-17 NOTE — Progress Notes (Signed)
[name] [address] [place], [place] [address] Phone: [phone] Subjective:   I Anne Singh am serving as a Education administrator for Dr. Hulan Saas.    CC: right hip pain   [id number]  Anne Singh is a 33 y.o. female coming in with complaint of right hip and left knee pain. Popping sound in the joint.   Onset- chronic Location - hip joint Character- achy, sharp  Aggravating factors- deep squat  Reliving factors-stretching Severity-6 out of 10   On the posterior aspect worse with certain activities as well.  Patient has been trying to workout on a regular basis.  Patient denies any significant instability.  No swelling. No past medical history on file. Past Surgical History:  Procedure Laterality Date  . WISDOM TOOTH EXTRACTION     Social History   Socioeconomic History  . Marital status: Soil scientist    Spouse name: Not on file  . Number of children: Not on file  . Years of education: Not on file  . Highest education level: Not on file  Occupational History  . Not on file  Social Needs  . Financial resource strain: Not on file  . Food insecurity    Worry: Not on file    Inability: Not on file  . Transportation needs    Medical: Not on file    Non-medical: Not on file  Tobacco Use  . Smoking status: Former Smoker  Substance and Sexual Activity  . Alcohol use: Yes  . Drug use: No  . Sexual activity: Yes    Birth control/protection: None  Lifestyle  . Physical activity    Days per week: Not on file    Minutes per session: Not on file  . Stress: Not on file  Relationships  . Social Herbalist on phone: Not on file    Gets together: Not on file    Attends religious service: Not on file    Active member of club or organization: Not on file    Attends meetings of clubs or organizations: Not on file    Relationship status: Not on file  Other Topics Concern  . Not on file  Social History Narrative  . Not on  file   No Known Allergies No family history on file.  No family history of autoimmune       Current Outpatient Medications (Other):  Marland Kitchen  Diclofenac Sodium 2 % SOLN, Place 2 g onto the skin 2 (two) times daily.    Past medical history, social, surgical and family history all reviewed in electronic medical record.  No pertanent information unless stated regarding to the chief complaint.   Review of Systems:  No headache, visual changes, nausea, vomiting, diarrhea, constipation, dizziness, abdominal pain, skin rash, fevers, chills, night sweats, weight loss, swollen lymph nodes, body aches, joint swelling, muscle aches, chest pain, shortness of breath, mood changes.   Objective  Blood pressure 116/76, pulse 89, height 5\' 2"  (1.575 m), weight 168 lb (76.2 kg), last menstrual period 04/03/2019, SpO2 98 %.    General: No apparent distress alert and oriented x3 mood and affect normal, dressed appropriately.  HEENT: Pupils equal, extraocular movements intact  Respiratory: Patient's speak in full sentences and does not appear short of breath  Cardiovascular: No lower extremity edema, non tender, no erythema  Skin: Warm dry intact with no signs of infection or rash on extremities or on axial skeleton.  Abdomen: Soft nontender  Neuro:  Cranial nerves II through XII are intact, neurovascularly intact in all extremities with 2+ DTRs and 2+ pulses.  Lymph: No lymphadenopathy of posterior or anterior cervical chain or axillae bilaterally.  Gait normal with good balance and coordination.  MSK:  Non tender with full range of motion and good stability and symmetric strength and tone of shoulders, elbows, wrist, and ankles bilaterally.  Hip: Right hip ROM IR: 25 Deg, ER: 30 Deg, Flexion: 120 Deg, Extension: 100 Deg, Abduction: 45 Deg, Adduction: 25 Deg Strength IR: 5/5, ER: 5/5, Flexion: 5/5, Extension: 5/5, Abduction: 5/5, Adduction: 5/5 Pelvic alignment unremarkable to inspection and palpation.  Standing hip rotation and gait without trendelenburg sign / unsteadiness. Pain over the greater trochanteric area specifically.. Iliac joints bilaterally  Patient is also having left knee pain.  This is in the posterior.  Clicking noted on the fossa.  Otherwise unremarkable with full range of motion  97110; 15 additional minutes spent for Therapeutic exercises as stated in above notes.  This included exercises focusing on stretching, strengthening, with significant focus on eccentric aspects.   Long term goals include an improvement in range of motion, strength, endurance as well as avoiding reinjury. Patient's frequency would include in 1-2 times a day, 3-5 times a week for a duration of 6-12 weeks. Hip strengthening exercises which included:  Pelvic tilt/bracing to help with proper recruitment of the lower abs and pelvic floor muscles  Glute strengthening to properly contract glutes without over-engaging low back and hamstrings - prone hip extension and glute bridge exercises Proper stretching techniques to increase effectiveness for the hip flexors, groin, quads, piriformic and low back when appropriate    Proper technique shown and discussed handout in great detail with ATC.  All questions were discussed and answered.     Impression and Recommendations:     This case required medical decision making of moderate complexity. The above documentation has been reviewed and is accurate and complete Anne SaaZachary M Smith, DO       Note: This dictation was prepared with Dragon dictation along with smaller phrase technology. Any transcriptional errors that result from this process are unintentional.

## 2019-04-18 ENCOUNTER — Encounter: Payer: Self-pay | Admitting: Family Medicine

## 2019-04-25 DIAGNOSIS — E119 Type 2 diabetes mellitus without complications: Secondary | ICD-10-CM | POA: Diagnosis not present

## 2019-04-25 DIAGNOSIS — F909 Attention-deficit hyperactivity disorder, unspecified type: Secondary | ICD-10-CM | POA: Diagnosis not present

## 2019-04-25 DIAGNOSIS — F331 Major depressive disorder, recurrent, moderate: Secondary | ICD-10-CM | POA: Diagnosis not present

## 2019-06-05 DIAGNOSIS — Z713 Dietary counseling and surveillance: Secondary | ICD-10-CM | POA: Diagnosis not present

## 2019-07-04 DIAGNOSIS — Z23 Encounter for immunization: Secondary | ICD-10-CM | POA: Diagnosis not present

## 2019-07-04 DIAGNOSIS — F909 Attention-deficit hyperactivity disorder, unspecified type: Secondary | ICD-10-CM | POA: Diagnosis not present

## 2019-07-04 DIAGNOSIS — F331 Major depressive disorder, recurrent, moderate: Secondary | ICD-10-CM | POA: Diagnosis not present

## 2019-07-04 DIAGNOSIS — E119 Type 2 diabetes mellitus without complications: Secondary | ICD-10-CM | POA: Diagnosis not present

## 2019-07-25 ENCOUNTER — Other Ambulatory Visit: Payer: Self-pay

## 2019-07-25 ENCOUNTER — Other Ambulatory Visit (INDEPENDENT_AMBULATORY_CARE_PROVIDER_SITE_OTHER): Payer: BC Managed Care – PPO

## 2019-07-25 ENCOUNTER — Ambulatory Visit: Payer: BC Managed Care – PPO | Admitting: Family Medicine

## 2019-07-25 VITALS — BP 118/60 | HR 88 | Ht 62.0 in | Wt 153.0 lb

## 2019-07-25 DIAGNOSIS — M7551 Bursitis of right shoulder: Secondary | ICD-10-CM | POA: Diagnosis not present

## 2019-07-25 DIAGNOSIS — M869 Osteomyelitis, unspecified: Secondary | ICD-10-CM

## 2019-07-25 DIAGNOSIS — M9903 Segmental and somatic dysfunction of lumbar region: Secondary | ICD-10-CM | POA: Diagnosis not present

## 2019-07-25 DIAGNOSIS — M2550 Pain in unspecified joint: Secondary | ICD-10-CM | POA: Diagnosis not present

## 2019-07-25 DIAGNOSIS — M5386 Other specified dorsopathies, lumbar region: Secondary | ICD-10-CM | POA: Diagnosis not present

## 2019-07-25 DIAGNOSIS — M9901 Segmental and somatic dysfunction of cervical region: Secondary | ICD-10-CM | POA: Diagnosis not present

## 2019-07-25 DIAGNOSIS — M5382 Other specified dorsopathies, cervical region: Secondary | ICD-10-CM | POA: Diagnosis not present

## 2019-07-25 LAB — CBC WITH DIFFERENTIAL/PLATELET
Basophils Absolute: 0.1 10*3/uL (ref 0.0–0.1)
Basophils Relative: 0.9 % (ref 0.0–3.0)
Eosinophils Absolute: 0.2 10*3/uL (ref 0.0–0.7)
Eosinophils Relative: 1.5 % (ref 0.0–5.0)
HCT: 43.7 % (ref 36.0–46.0)
Hemoglobin: 14.7 g/dL (ref 12.0–15.0)
Lymphocytes Relative: 23.5 % (ref 12.0–46.0)
Lymphs Abs: 2.6 10*3/uL (ref 0.7–4.0)
MCHC: 33.7 g/dL (ref 30.0–36.0)
MCV: 86.2 fl (ref 78.0–100.0)
Monocytes Absolute: 0.6 10*3/uL (ref 0.1–1.0)
Monocytes Relative: 5.1 % (ref 3.0–12.0)
Neutro Abs: 7.5 10*3/uL (ref 1.4–7.7)
Neutrophils Relative %: 69 % (ref 43.0–77.0)
Platelets: 376 10*3/uL (ref 150.0–400.0)
RBC: 5.07 Mil/uL (ref 3.87–5.11)
RDW: 13.5 % (ref 11.5–15.5)
WBC: 10.9 10*3/uL — ABNORMAL HIGH (ref 4.0–10.5)

## 2019-07-25 LAB — COMPREHENSIVE METABOLIC PANEL
ALT: 23 U/L (ref 0–35)
AST: 17 U/L (ref 0–37)
Albumin: 4.4 g/dL (ref 3.5–5.2)
Alkaline Phosphatase: 64 U/L (ref 39–117)
BUN: 17 mg/dL (ref 6–23)
CO2: 29 mEq/L (ref 19–32)
Calcium: 10.2 mg/dL (ref 8.4–10.5)
Chloride: 102 mEq/L (ref 96–112)
Creatinine, Ser: 0.67 mg/dL (ref 0.40–1.20)
GFR: 102.23 mL/min (ref >60.00)
Glucose, Bld: 118 mg/dL — ABNORMAL HIGH (ref 70–99)
Potassium: 3.9 mEq/L (ref 3.5–5.1)
Sodium: 139 mEq/L (ref 135–145)
Total Bilirubin: 0.3 mg/dL (ref 0.2–1.2)
Total Protein: 8 g/dL (ref 6.0–8.3)

## 2019-07-25 LAB — IBC PANEL
Iron: 70 ug/dL (ref 42–145)
Saturation Ratios: 18.7 % — ABNORMAL LOW (ref 20.0–50.0)
Transferrin: 268 mg/dL (ref 212.0–360.0)

## 2019-07-25 LAB — C-REACTIVE PROTEIN: CRP: <1 mg/dL (ref 0.5–20.0)

## 2019-07-25 LAB — TSH: TSH: 2.87 u[IU]/mL (ref 0.35–4.50)

## 2019-07-25 LAB — SEDIMENTATION RATE: Sed Rate: 33 mm/hr — ABNORMAL HIGH (ref 0–20)

## 2019-07-25 LAB — CORTISOL: Cortisol, Plasma: 9 ug/dL

## 2019-07-25 LAB — FERRITIN: Ferritin: 50.7 ng/mL (ref 10.0–291.0)

## 2019-07-25 LAB — URIC ACID: Uric Acid, Serum: 4.2 mg/dL (ref 2.4–7.0)

## 2019-07-25 MED ORDER — COLCHICINE 0.6 MG PO TABS: 0.6000 mg | ORAL_TABLET | Freq: Every day | ORAL | 0 refills | Status: DC

## 2019-07-25 MED ORDER — NITROGLYCERIN 0.1 MG/HR TD PT24: MEDICATED_PATCH | TRANSDERMAL | 12 refills | Status: DC

## 2019-07-25 NOTE — Progress Notes (Signed)
[name] [place], [place] [address] Phone: [phone] Subjective:   I Anne Singh am serving as a Education administrator for Dr. Hulan Saas.  I'm seeing this patient by the request  of:    CC: Significant amount of pain in multiple joints  BJY:[id number]   04/17/2019 Popliteal pain.  Discussed which activities of doing which wants to avoid.  We discussed with patient about compression and how this can be beneficial.  Discussed the importance of hamstring strengthening.  Follow-up again in 4 to 6 weeks  Snapping hip.  Discussed posture and ergonomics.  We discussed weight loss, we discussed compression.  Follow-up again in 4 to 6 weeks  07/25/2019 Anne Singh is a 33 y.o. female coming in with complaint of bilateral hip, right shoulder and left knee pain. States she is experiencing numbness and tingling in the posterior shoulder. States she has pressure behind the left knee. Sitting cross legged causes stiffness in the knee.       No past medical history on file. Past Surgical History:  Procedure Laterality Date  . WISDOM TOOTH EXTRACTION     Social History   Socioeconomic History  . Marital status: Single    Spouse name: Not on file  . Number of children: Not on file  . Years of education: Not on file  . Highest education level: Not on file  Occupational History  . Not on file  Social Needs  . Financial resource strain: Not on file  . Food insecurity    Worry: Not on file    Inability: Not on file  . Transportation needs    Medical: Not on file    Non-medical: Not on file  Tobacco Use  . Smoking status: Former Smoker  Substance and Sexual Activity  . Alcohol use: Yes  . Drug use: No  . Sexual activity: Yes    Birth control/protection: None  Lifestyle  . Physical activity    Days per week: Not on file    Minutes per session: Not on file  . Stress: Not on file  Relationships  . Social Herbalist on phone: Not on  file    Gets together: Not on file    Attends religious service: Not on file    Active member of club or organization: Not on file    Attends meetings of clubs or organizations: Not on file    Relationship status: Not on file  Other Topics Concern  . Not on file  Social History Narrative  . Not on file   No Known Allergies No family history on file.   Current Outpatient Medications (Cardiovascular):  .  nitroGLYCERIN (NITRO-DUR) 0.1 mg/hr patch, 1/4 patch daily   Current Outpatient Medications (Analgesics):  .  colchicine 0.6 MG tablet, Take 1 tablet (0.6 mg total) by mouth daily.   Current Outpatient Medications (Other):  Marland Kitchen  Diclofenac Sodium 2 % SOLN, Place 2 g onto the skin 2 (two) times daily.    Past medical history, social, surgical and family history all reviewed in electronic medical record.  No pertanent information unless stated regarding to the chief complaint.   Review of Systems:  No headache, visual changes, nausea, vomiting, diarrhea, constipation, dizziness, abdominal pain, skin rash, fevers, chills, night sweats, weight loss, swollen lymph nodes, body aches, joint swelling,  chest pain, shortness of breath, mood changes.  Positive muscle aches  Objective  Blood pressure 118/60, pulse 88, height 5\' 2"  (  1.575 m), weight 153 lb (69.4 kg), SpO2 98 %.    General: No apparent distress alert and oriented x3 mood and affect normal, dressed appropriately.  HEENT: Pupils equal, extraocular movements intact  Respiratory: Patient's speak in full sentences and does not appear short of breath  Cardiovascular: No lower extremity edema, non tender, no erythema  Skin: Warm dry intact with no signs of infection or rash on extremities or on axial skeleton.  Abdomen: Soft nontender  Neuro: Cranial nerves II through XII are intact, neurovascularly intact in all extremities with 2+ DTRs and 2+ pulses.  Lymph: No lymphadenopathy of posterior or anterior cervical chain or axillae  bilaterally.  Gait normal with good balance and coordination.  MSK:  tender with full range of motion and good stability and symmetric strength and tone of  elbows, wrist,, knee and ankles bilaterally.   Patient's hip exam shows the patient has near full range of motion of both hips but patient is severely tender over the pubic symphysis.    Patient's right shoulder does show some positive impingement.  4-5 strength compared to contralateral side.  Neurovascular intact distally.  Negative O'Brien's  Limited musculoskeletal ultrasound was performed and interpreted by Judi Saa  Limited ultrasound the right shoulder does show the patient have a partial tear of the supraspinatus.  Some mild abnormal calcific changes noted.  Active subacromial bursitis noted Impression: Partial rotator cuff tear   Impression and Recommendations:     This case required medical decision making of moderate complexity. The above documentation has been reviewed and is accurate and complete Judi Saa, DO       Note: This dictation was prepared with Dragon dictation along with smaller phrase technology. Any transcriptional errors that result from this process are unintentional.

## 2019-07-25 NOTE — Patient Instructions (Addendum)
Good to see you Labs downstairs Exercise 3 times a week Nitroglycerin Protocol   Apply 1/4 nitroglycerin patch to affected area daily.  Change position of patch within the affected area every 24 hours.  You may experience a headache during the first 1-2 weeks of using the patch, these should subside.  If you experience headaches after beginning nitroglycerin patch treatment, you may take your preferred over the counter pain reliever.  Another side effect of the nitroglycerin patch is skin irritation or rash related to patch adhesive.  Please notify our office if you develop more severe headaches or rash, and stop the patch.  Tendon healing with nitroglycerin patch may require 12 to 24 weeks depending on the extent of injury.  Men should not use if taking Viagra, Cialis, or Levitra.   Do not use if you have migraines or rosacea. See me again in 5 weeks

## 2019-07-26 ENCOUNTER — Encounter: Payer: Self-pay | Admitting: Family Medicine

## 2019-07-26 DIAGNOSIS — M2550 Pain in unspecified joint: Secondary | ICD-10-CM | POA: Insufficient documentation

## 2019-07-26 DIAGNOSIS — M869 Osteomyelitis, unspecified: Secondary | ICD-10-CM | POA: Insufficient documentation

## 2019-07-26 NOTE — Assessment & Plan Note (Signed)
Short course of colchicine given, patient wanted to avoid prednisone, secondary to her diabetes, follow-up again in 4 to 8 weeks

## 2019-07-26 NOTE — Assessment & Plan Note (Signed)
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

## 2019-07-26 NOTE — Assessment & Plan Note (Signed)
Patient has had chronic bursitis of the shoulder and now does have a partial tear of the rotator cuff.  Discussed with patient again at great length.  Started on nitroglycerin and warned potential side effects, because of patient's polyarthralgia we will get laboratory work-up.  Has had significant difficulty.  Past medical history significant for diabetes the patient has lost significant amount of weight and is trying to continue to work on a regular basis.

## 2019-07-29 ENCOUNTER — Encounter: Payer: Self-pay | Admitting: Family Medicine

## 2019-07-30 LAB — VITAMIN D 1,25 DIHYDROXY
Vitamin D 1, 25 (OH)2 Total: 61 pg/mL (ref 18–72)
Vitamin D2 1, 25 (OH)2: <8 pg/mL
Vitamin D3 1, 25 (OH)2: 61 pg/mL

## 2019-07-30 LAB — ANA: Anti Nuclear Antibody (ANA): NEGATIVE

## 2019-07-30 LAB — CALCIUM, IONIZED: Calcium, Ion: 5.4 mg/dL (ref 4.8–5.6)

## 2019-07-30 LAB — CYCLIC CITRUL PEPTIDE ANTIBODY, IGG: Cyclic Citrullin Peptide Ab: <16 UNITS

## 2019-07-30 LAB — ANGIOTENSIN CONVERTING ENZYME: Angiotensin-Converting Enzyme: <1 U/L — ABNORMAL LOW (ref 9–67)

## 2019-07-30 LAB — DHEA: DHEA: 360 ng/dL

## 2019-07-30 LAB — PTH, INTACT AND CALCIUM
Calcium: 10.1 mg/dL (ref 8.6–10.2)
PTH: 24 pg/mL (ref 14–64)

## 2019-07-30 LAB — RHEUMATOID FACTOR: Rheumatoid fact SerPl-aCnc: <14 IU/mL (ref <14)

## 2019-07-31 DIAGNOSIS — F4320 Adjustment disorder, unspecified: Secondary | ICD-10-CM | POA: Diagnosis not present

## 2019-08-01 MED ORDER — VITAMIN D (ERGOCALCIFEROL) 1.25 MG (50000 UNIT) PO CAPS: 50000.0000 [IU] | ORAL_CAPSULE | ORAL | 0 refills | Status: DC

## 2019-08-07 DIAGNOSIS — F4320 Adjustment disorder, unspecified: Secondary | ICD-10-CM | POA: Diagnosis not present

## 2019-08-10 DIAGNOSIS — M9903 Segmental and somatic dysfunction of lumbar region: Secondary | ICD-10-CM | POA: Diagnosis not present

## 2019-08-10 DIAGNOSIS — M9901 Segmental and somatic dysfunction of cervical region: Secondary | ICD-10-CM | POA: Diagnosis not present

## 2019-08-10 DIAGNOSIS — M5386 Other specified dorsopathies, lumbar region: Secondary | ICD-10-CM | POA: Diagnosis not present

## 2019-08-10 DIAGNOSIS — M5382 Other specified dorsopathies, cervical region: Secondary | ICD-10-CM | POA: Diagnosis not present

## 2019-08-21 DIAGNOSIS — F4320 Adjustment disorder, unspecified: Secondary | ICD-10-CM | POA: Diagnosis not present

## 2019-08-29 ENCOUNTER — Encounter: Payer: Self-pay | Admitting: Family Medicine

## 2019-08-29 ENCOUNTER — Ambulatory Visit (INDEPENDENT_AMBULATORY_CARE_PROVIDER_SITE_OTHER): Payer: BC Managed Care – PPO | Admitting: Family Medicine

## 2019-08-29 ENCOUNTER — Other Ambulatory Visit: Payer: Self-pay

## 2019-08-29 DIAGNOSIS — M2550 Pain in unspecified joint: Secondary | ICD-10-CM

## 2019-08-29 DIAGNOSIS — M7551 Bursitis of right shoulder: Secondary | ICD-10-CM

## 2019-08-29 NOTE — Assessment & Plan Note (Signed)
Continues to have polyarthralgia and may be on the spectrum of a chronic pain syndrome.  I do believe some underlying anxiety could be contributing and patient is having some recent relationship problems that could be contributing.  We discussed different medications that could also help with pain as well as her underlying anxiety.  Patient wants to discuss with primary care provider which I think is a good idea.  Patient will follow up with me again in 4 to 8 weeks.

## 2019-08-29 NOTE — Patient Instructions (Signed)
Good to see you Effexor 37.5 cymbalta 20 mg I think you will get better in the long run Discontinue nitro in 2 weeks  See me again in 8 weeks

## 2019-08-29 NOTE — Assessment & Plan Note (Signed)
Patient is doing significantly better at this time.  Discussed with him titrating off of the nitroglycerin patches.  I believe the patient will do significantly well with conservative therapy at this time.  Patient is to be able to start increasing weight again over the course the next 2 weeks.  Patient should be pain free him helping in 4 weeks.  Follow-up again at the end of that time

## 2019-08-29 NOTE — Progress Notes (Signed)
[facility] [place],  [id number] Phone: [phone] Subjective:   I Anne Singh am serving as a Education administrator for Dr. Hulan Saas.  I'm seeing this patient by the request  of:    CC: Polyarthralgia  MCN:[id number]   07/25/2019 Patient has had chronic bursitis of the shoulder and now does have a partial tear of the rotator cuff.  Discussed with patient again at great length.  Started on nitroglycerin and warned potential side effects, because of patient's polyarthralgia we will get laboratory work-up.  Has had significant difficulty.  Past medical history significant for diabetes the patient has lost significant amount of weight and is trying to continue to work on a regular basis.  Short course of colchicine given, patient wanted to avoid prednisone, secondary to her diabetes, follow-up again in 4 to 8 weeks  08/29/2019 Anne Singh is a 33 y.o. female coming in with complaint of shoulder pain. States she feels about the same.  Patient has been doing the nitroglycerin, but feels like she is having some mild improvement but more tightness.  Patient feels that she is able to workout on a more regular basis she would do relatively better.  Patient denies any numbness or tingling.     No past medical history on file. Past Surgical History:  Procedure Laterality Date  . WISDOM TOOTH EXTRACTION     Social History   Socioeconomic History  . Marital status: Single    Spouse name: Not on file  . Number of children: Not on file  . Years of education: Not on file  . Highest education level: Not on file  Occupational History  . Not on file  Social Needs  . Financial resource strain: Not on file  . Food insecurity    Worry: Not on file    Inability: Not on file  . Transportation needs    Medical: Not on file    Non-medical: Not on file  Tobacco Use  . Smoking status: Former Smoker  Substance and Sexual Activity  . Alcohol use: Yes  .  Drug use: No  . Sexual activity: Yes    Birth control/protection: None  Lifestyle  . Physical activity    Days per week: Not on file    Minutes per session: Not on file  . Stress: Not on file  Relationships  . Social Herbalist on phone: Not on file    Gets together: Not on file    Attends religious service: Not on file    Active member of club or organization: Not on file    Attends meetings of clubs or organizations: Not on file    Relationship status: Not on file  Other Topics Concern  . Not on file  Social History Narrative  . Not on file   No Known Allergies No family history on file.   Current Outpatient Medications (Cardiovascular):  .  nitroGLYCERIN (NITRO-DUR) 0.1 mg/hr patch, 1/4 patch daily   Current Outpatient Medications (Analgesics):  .  colchicine 0.6 MG tablet, Take 1 tablet (0.6 mg total) by mouth daily.   Current Outpatient Medications (Other):  Marland Kitchen  Diclofenac Sodium 2 % SOLN, Place 2 g onto the skin 2 (two) times daily. .  Vitamin D, Ergocalciferol, (DRISDOL) 1.25 MG (50000 UT) CAPS capsule, Take 1 capsule (50,000 Units total) by mouth every 7 (seven) days.    Past medical history, social, surgical and family history all reviewed in electronic  medical record.  No pertanent information unless stated regarding to the chief complaint.   Review of Systems:  No headache, visual changes, nausea, vomiting, diarrhea, constipation, dizziness, abdominal pain, skin rash, fevers, chills, night sweats, weight loss, swollen lymph nodes, , joint swelling, chest pain, shortness of breath, mood changes.  Positive muscle aches, body aches  Objective  Blood pressure 100/62, pulse 95, height 5\' 2"  (1.575 m), weight 154 lb (69.9 kg), SpO2 98 %.    General: No apparent distress alert and oriented x3 mood and affect normal, dressed appropriately.  HEENT: Pupils equal, extraocular movements intact mild bluish hue to the posterior pharynx Respiratory: Patient's  speak in full sentences and does not appear short of breath  Cardiovascular: No lower extremity edema, non tender, no erythema  Skin: Warm dry intact with no signs of infection or rash on extremities or on axial skeleton.  Abdomen: Soft nontender  Neuro: Cranial nerves II through XII are intact, neurovascularly intact in all extremities with 2+ DTRs and 2+ pulses.  Lymph: Lymph nodes noted on the posterior aspect of the head right area minorly enlarged but tender. Gait normal with good balance and coordination.  MSK:  tender with full range of motion and good stability and symmetric strength and tone of  elbows, wrist, hip, knee and ankles bilaterally.  Right shoulder exam shows the patient does have good range of motion, mild pain with impingement but 5 out of 5 strength of the rotator cuff. Contralateral lateral shoulder unremarkable    Impression and Recommendations:     This case required medical decision making of moderate complexity. The above documentation has been reviewed and is accurate and complete , DO       Note: This dictation was prepared with Dragon dictation along with smaller phrase technology. Any transcriptional errors that result from this process are unintentional.

## 2019-08-30 ENCOUNTER — Encounter: Payer: Self-pay | Admitting: Family Medicine

## 2019-08-30 DIAGNOSIS — F331 Major depressive disorder, recurrent, moderate: Secondary | ICD-10-CM | POA: Diagnosis not present

## 2019-09-05 DIAGNOSIS — R5383 Other fatigue: Secondary | ICD-10-CM | POA: Diagnosis not present

## 2019-09-05 DIAGNOSIS — F331 Major depressive disorder, recurrent, moderate: Secondary | ICD-10-CM | POA: Diagnosis not present

## 2019-09-05 DIAGNOSIS — E119 Type 2 diabetes mellitus without complications: Secondary | ICD-10-CM | POA: Diagnosis not present

## 2019-09-05 DIAGNOSIS — R5381 Other malaise: Secondary | ICD-10-CM | POA: Diagnosis not present

## 2019-09-05 DIAGNOSIS — Z01419 Encounter for gynecological examination (general) (routine) without abnormal findings: Secondary | ICD-10-CM | POA: Diagnosis not present

## 2019-09-11 DIAGNOSIS — M542 Cervicalgia: Secondary | ICD-10-CM | POA: Diagnosis not present

## 2019-09-11 DIAGNOSIS — M5136 Other intervertebral disc degeneration, lumbar region: Secondary | ICD-10-CM | POA: Diagnosis not present

## 2019-09-11 DIAGNOSIS — M47814 Spondylosis without myelopathy or radiculopathy, thoracic region: Secondary | ICD-10-CM | POA: Diagnosis not present

## 2019-09-14 DIAGNOSIS — M9901 Segmental and somatic dysfunction of cervical region: Secondary | ICD-10-CM | POA: Diagnosis not present

## 2019-09-14 DIAGNOSIS — M5386 Other specified dorsopathies, lumbar region: Secondary | ICD-10-CM | POA: Diagnosis not present

## 2019-09-14 DIAGNOSIS — M5382 Other specified dorsopathies, cervical region: Secondary | ICD-10-CM | POA: Diagnosis not present

## 2019-09-14 DIAGNOSIS — M9903 Segmental and somatic dysfunction of lumbar region: Secondary | ICD-10-CM | POA: Diagnosis not present

## 2019-09-19 DIAGNOSIS — M5386 Other specified dorsopathies, lumbar region: Secondary | ICD-10-CM | POA: Diagnosis not present

## 2019-09-19 DIAGNOSIS — M9901 Segmental and somatic dysfunction of cervical region: Secondary | ICD-10-CM | POA: Diagnosis not present

## 2019-09-19 DIAGNOSIS — M9903 Segmental and somatic dysfunction of lumbar region: Secondary | ICD-10-CM | POA: Diagnosis not present

## 2019-09-19 DIAGNOSIS — M5382 Other specified dorsopathies, cervical region: Secondary | ICD-10-CM | POA: Diagnosis not present

## 2019-10-24 ENCOUNTER — Ambulatory Visit: Payer: BC Managed Care – PPO | Admitting: Family Medicine

## 2019-11-01 ENCOUNTER — Other Ambulatory Visit: Payer: Self-pay

## 2019-11-01 ENCOUNTER — Ambulatory Visit: Payer: BC Managed Care – PPO | Admitting: Family Medicine

## 2019-11-01 ENCOUNTER — Encounter: Payer: Self-pay | Admitting: Family Medicine

## 2019-11-01 DIAGNOSIS — M2550 Pain in unspecified joint: Secondary | ICD-10-CM

## 2019-11-01 NOTE — Assessment & Plan Note (Signed)
Patient is making significant strides at the moment.  I do believe the patient is doing better with the Effexor.  Patient did not want to change the medication anymore.  We will continue with the same dosing of 37.5 mg.  Follow-up again in 6 to 8 weeks

## 2019-11-01 NOTE — Patient Instructions (Signed)
Exercise 3 times a week Arnica lotion Continue Effexor See me again in 6 weeks

## 2019-11-01 NOTE — Progress Notes (Signed)
[facility] [address] [place] [address] Phone: [phone] Subjective:   I Anne Singh am serving as a Neurosurgeon for Dr. Antoine Primas.  This visit occurred during the SARS-CoV-2 public health emergency.  Safety protocols were in place, including screening questions prior to the visit, additional usage of staff PPE, and extensive cleaning of exam room while observing appropriate contact time as indicated for disinfecting solutions.   I'm seeing this patient by the request  of:    CC: New onset click.  EHU:[id number]   08/29/2019 Continues to have polyarthralgia and may be on the spectrum of a chronic pain syndrome.  I do believe some underlying anxiety could be contributing and patient is having some recent relationship problems that could be contributing.  We discussed different medications that could also help with pain as well as her underlying anxiety.  Patient wants to discuss with primary care provider which I think is a good idea.  Patient will follow up with me again in 4 to 8 weeks.  Patient is doing significantly better at this time.  Discussed with him titrating off of the nitroglycerin patches.  I believe the patient will do significantly well with conservative therapy at this time.  Patient is to be able to start increasing weight again over the course the next 2 weeks.  Patient should be pain free him helping in 4 weeks.  Follow-up again at the end of that time  Update 11/01/2019 Anne Singh is a 33 y.o. female coming in with complaint of right shoulder pain. Patient states she is doing well. Effexor has been helping with nerve pain. Larey Seat off a horse. Right hip pain. Glut muscle is painful.  Patient has had this area hurt before but it seems to be a little bit different.  Was doing better for some time.  In addition to this patient is doing much better though with the shoulder with the nitroglycerin patches and discontinued at the moment.   Patient is also has been taking Effexor and has noticed no change in her mood but states that the nerve pain is significantly better.     No past medical history on file. Past Surgical History:  Procedure Laterality Date  . WISDOM TOOTH EXTRACTION     Social History   Socioeconomic History  . Marital status: Single    Spouse name: Not on file  . Number of children: Not on file  . Years of education: Not on file  . Highest education level: Not on file  Occupational History  . Not on file  Tobacco Use  . Smoking status: Former Smoker  Substance and Sexual Activity  . Alcohol use: Yes  . Drug use: No  . Sexual activity: Yes    Birth control/protection: None  Other Topics Concern  . Not on file  Social History Narrative  . Not on file   Social Determinants of Health   Financial Resource Strain:   . Difficulty of Paying Living Expenses: Not on file  Food Insecurity:   . Worried About Programme researcher, broadcasting/film/video in the Last Year: Not on file  . Ran Out of Food in the Last Year: Not on file  Transportation Needs:   . Lack of Transportation (Medical): Not on file  . Lack of Transportation (Non-Medical): Not on file  Physical Activity:   . Days of Exercise per Week: Not on file  . Minutes of Exercise per Session: Not on file  Stress:   .  Feeling of Stress : Not on file  Social Connections:   . Frequency of Communication with Friends and Family: Not on file  . Frequency of Social Gatherings with Friends and Family: Not on file  . Attends Religious Services: Not on file  . Active Member of Clubs or Organizations: Not on file  . Attends Archivist Meetings: Not on file  . Marital Status: Not on file   No Known Allergies No family history on file.   Current Outpatient Medications (Cardiovascular):  .  nitroGLYCERIN (NITRO-DUR) 0.1 mg/hr patch, 1/4 patch daily     Current Outpatient Medications (Other):  Marland Kitchen  Diclofenac Sodium 2 % SOLN, Place 2 g onto the skin 2  (two) times daily. .  Vitamin D, Ergocalciferol, (DRISDOL) 1.25 MG (50000 UT) CAPS capsule, Take 1 capsule (50,000 Units total) by mouth every 7 (seven) days.    Past medical history, social, surgical and family history all reviewed in electronic medical record.  No pertanent information unless stated regarding to the chief complaint.   Review of Systems:  No headache, visual changes, nausea, vomiting, diarrhea, constipation, dizziness, abdominal pain, skin rash, fevers, chills, night sweats, weight loss, swollen lymph nodes, body aches, joint swelling,chest pain, shortness of breath, mood changes.  Positive muscle aches  Objective  Blood pressure 130/80, pulse 94, height 5\' 2"  (1.575 m), weight 154 lb (69.9 kg), SpO2 98 %.    General: No apparent distress alert and oriented x3 mood and affect normal, dressed appropriately.  HEENT: Pupils equal, extraocular movements intact  Respiratory: Patient's speak in full sentences and does not appear short of breath  Cardiovascular: No lower extremity edema, non tender, no erythema  Skin: Warm dry intact with no signs of infection or rash on extremities or on axial skeleton.  Abdomen: Soft nontender  Neuro: Cranial nerves II through XII are intact, neurovascularly intact in all extremities with 2+ DTRs and 2+ pulses.  Lymph: No lymphadenopathy of posterior or anterior cervical chain or axillae bilaterally.  Gait mild antalgic MSK:  tender with full range of motion and good stability and symmetric strength and tone of shoulders, elbows, wrist, knee and ankles bilaterally.  Right hip mild tightness with Corky Sox but otherwise full range of motion.  Possible small hematoma noted in the posterior buttocks area.  Freely movable but is tender to palpation.   Impression and Recommendations:     This case required medical decision making of moderate complexity. The above documentation has been reviewed and is accurate and complete Lyndal Pulley, DO        Note: This dictation was prepared with Dragon dictation along with smaller phrase technology. Any transcriptional errors that result from this process are unintentional.

## 2019-12-11 ENCOUNTER — Ambulatory Visit: Payer: BC Managed Care – PPO | Admitting: Family Medicine

## 2020-07-12 ENCOUNTER — Ambulatory Visit: Payer: 59 | Admitting: Pulmonary Disease

## 2020-07-12 ENCOUNTER — Other Ambulatory Visit: Payer: Self-pay

## 2020-07-12 ENCOUNTER — Encounter: Payer: Self-pay | Admitting: Pulmonary Disease

## 2020-07-12 VITALS — BP 124/72 | HR 98 | Ht 62.0 in | Wt 175.0 lb

## 2020-07-12 DIAGNOSIS — R0683 Snoring: Secondary | ICD-10-CM | POA: Diagnosis not present

## 2020-07-12 NOTE — Patient Instructions (Signed)
Will arrange for home sleep study Will call to arrange for follow up after sleep study reviewed  

## 2020-07-12 NOTE — Progress Notes (Signed)
[name]  Chief Complaint  Patient presents with  . Consult    Self Referral-States she has been having some issues staying asleep at night. Wakes up groggly during the day. Had a history of snoring in the past but has noticed since she lost weight, she has stopped snoring.      Constitutional:  BP 124/72   Pulse 98   Ht 5\' 2"  (1.575 m)   Wt 175 lb (79.4 kg)   SpO2 98% Comment: on RA  BMI 32.01 kg/m   Past Medical History:  DM type 2, Vit D deficiency, Depression, ADHD, HTN, Arthralgia, Asthma  Past Surgical History:  Her  has a past surgical history that includes Wisdom tooth extraction.  Brief Summary:  Anne Singh is a 34 y.o. female with sleep difficulties.      Subjective:   She has always noticed trouble with her sleep.  She used to be able to fall asleep easily, but then would have trouble staying asleep.  For the past several months she is having trouble falling asleep also.  She snores some, but this is better after she lost about 30 lbs.  She talks in her sleep and will get violent nightmares.  Her partner says she will be sitting up in bed talking while asleep.  No report that she acts out any dream content or has injured herself while asleep.  She had traumatic experience when she was a teenager.  She is followed by behavioral health for depression and ADHD.  She feels her mood is better than it has been.  She gets episodes of teeth clenching.  Her father has sleep apnea.  She goes to bed at 10 pm.  She doesn't feel sleepy until 2 am.  She takes about an hour to fall asleep.  She wakes up frequently because of joint pain issues.  She gets out of bed at 730 am.  She still feels tired.  She was recently switched to adderrall and takes this in the morning.  She works from home and work day starts at 8 am.  She sleeps in on weekends for couple of hours, but still feels tired.  She drinks 2 cups of coffee in the  morning.  There is no history of restless legs.  She denies sleep hallucinations, sleep paralysis, or cataplexy.  The Epworth score is 2 out of 24.    Physical Exam:   Appearance - well kempt   ENMT - no sinus tenderness, no oral exudate, no LAN, Mallampati 3 airway, no stridor, low laying soft palate, 2+ tonsils  Respiratory - equal breath sounds bilaterally, no wheezing or rales  CV - s1s2 regular rate and rhythm, no murmurs  Ext - no clubbing, no edema  Skin - no rashes  Psych - normal mood and affect   Sleep Tests:    Social History:  She  reports that she has quit smoking. She has never used smokeless tobacco. She reports current alcohol use. She reports that she does not use drugs.  Family History:  Her family history includes Alcohol abuse in her mother; Bipolar disorder in her mother; Depression in her brother and mother; Diabetes in her father and mother; Hyperlipidemia in her father; Sarcoidosis in her mother; Suicidality in her mother.     Discussion:  Her sleep issues are likely multifactorial.  She reports sleep talking, teeth clenching, and nightmares.  She has history of diabetes, hypertension and depression.  She  snores and could have obstructive sleep apnea.  She might also have delayed sleep phase.  Assessment/Plan:   Snoring. - will arrange for home sleep study to assess presence of obstructive sleep apnea  Somniloquy, nightmares, teeth clenching. - could have REM and non REM parasomnias - need to assess for sleep apnea first and then determine if further intervention needed for these  Delayed sleep phase. - discussed importance of regular sleep cycle and restricting time in bed - advised her to get bright light in the morning and avoid this in the late evening  Depression, ADHD. - followed by PCP and behavioral medicine  She has requested that follow up visit after home sleep study be done virtually, and this should be okay.  Time Spent  Involved in Patient Care on Day of Examination:  37 minutes  Follow up:  Patient Instructions  Will arrange for home sleep study Will call to arrange for follow up after sleep study reviewed    Medication List:   Allergies as of 07/12/2020   No Known Allergies     Medication List       Accurate as of July 12, 2020 12:59 PM. If you have any questions, ask your nurse or doctor.        STOP taking these medications   nitroGLYCERIN 0.1 mg/hr patch Commonly known as: Nitro-Dur Stopped by: Coralyn Helling, MD   Vitamin D (Ergocalciferol) 1.25 MG (50000 UNIT) Caps capsule Commonly known as: DRISDOL Stopped by: Coralyn Helling, MD     TAKE these medications   Adderall XR 20 MG 24 hr capsule Generic drug: amphetamine-dextroamphetamine Take 20 mg by mouth every morning.   Diclofenac Sodium 2 % Soln Place 2 g onto the skin 2 (two) times daily.   metFORMIN 500 MG tablet Commonly known as: GLUCOPHAGE Take 500 mg by mouth 2 (two) times daily.   sertraline 25 MG tablet Commonly known as: ZOLOFT Take 25 mg by mouth daily.   venlafaxine XR 37.5 MG 24 hr capsule Commonly known as: EFFEXOR-XR   Ventolin HFA 108 (90 Base) MCG/ACT inhaler Generic drug: albuterol Inhale 2 puffs into the lungs as needed.       Signature:  Coralyn Helling, MD [facility] Pager - [phone] 07/12/2020, 12:59 PM

## 2020-10-10 ENCOUNTER — Ambulatory Visit: Payer: 59

## 2020-10-10 ENCOUNTER — Other Ambulatory Visit: Payer: Self-pay

## 2020-10-10 DIAGNOSIS — G4733 Obstructive sleep apnea (adult) (pediatric): Secondary | ICD-10-CM

## 2020-10-10 DIAGNOSIS — R0683 Snoring: Secondary | ICD-10-CM

## 2020-10-11 ENCOUNTER — Telehealth: Payer: Self-pay | Admitting: Pulmonary Disease

## 2020-10-11 DIAGNOSIS — G4733 Obstructive sleep apnea (adult) (pediatric): Secondary | ICD-10-CM

## 2020-10-11 NOTE — Telephone Encounter (Signed)
HST 10/10/20 >> AHI 7.7, SpO2 low 90%   Please inform her that her sleep study shows mild obstructive sleep apnea.  Please arrange for ROV with me or NP to discuss treatment options.

## 2020-10-11 NOTE — Telephone Encounter (Signed)
Called and spoke with pt about results of HST and that VS recommends [place] to schedule her an appt with provider to further discuss results and tx options. Pt verbalized understanding. appt scheduled for pt with Elisha Headland, NP 12/29. Nothing further needed.

## 2020-10-30 ENCOUNTER — Ambulatory Visit (INDEPENDENT_AMBULATORY_CARE_PROVIDER_SITE_OTHER): Payer: 59 | Admitting: Pulmonary Disease

## 2020-10-30 ENCOUNTER — Other Ambulatory Visit: Payer: Self-pay

## 2020-10-30 ENCOUNTER — Encounter: Payer: Self-pay | Admitting: Pulmonary Disease

## 2020-10-30 VITALS — BP 128/82 | HR 98 | Temp 97.3°F | Ht 62.0 in | Wt 172.4 lb

## 2020-10-30 DIAGNOSIS — G4733 Obstructive sleep apnea (adult) (pediatric): Secondary | ICD-10-CM | POA: Diagnosis not present

## 2020-10-30 DIAGNOSIS — Z23 Encounter for immunization: Secondary | ICD-10-CM

## 2020-10-30 DIAGNOSIS — Z0000 Encounter for general adult medical examination without abnormal findings: Secondary | ICD-10-CM | POA: Insufficient documentation

## 2020-10-30 NOTE — Patient Instructions (Addendum)
You were seen today by Coral Ceo, NP  for:   1. OSA (obstructive sleep apnea)  New CPAP start  Mask of choice  APAP 5-15 Supplies   We recommend that you start using your CPAP daily >>>Keep up the hard work using your device >>> Goal should be wearing this for the entire night that you are sleeping, at least 4 to 6 hours  Remember:  . Do not drive or operate heavy machinery if tired or drowsy.  . Please notify the supply company and office if you are unable to use your device regularly due to missing supplies or machine being broken.  . Work on maintaining a healthy weight and following your recommended nutrition plan  . Maintain proper daily exercise and movement  . Maintaining proper use of your device can also help improve management of other chronic illnesses such as: Blood pressure, blood sugars, and weight management.   BiPAP/ CPAP Cleaning:  >>>Clean weekly, with Dawn soap, and bottle brush.  Set up to air dry. >>> Wipe mask out daily with wet wipe or towelette    2. Healthcare maintenance  Flu vaccine today   Recommend COVID19 Booster   You should also have the pneumovax23 as you are a diabetic    Follow Up:    Return in about 3 months (around 01/28/2021), or if symptoms worsen or fail to improve, for Follow up with Dr. Craige Cotta.   Notification of test results are managed in the following manner: If there are  any recommendations or changes to the  plan of care discussed in office today,  we will contact you and let you know what they are. If you do not hear from [place], then your results are normal and you can view them through your  MyChart account , or a letter will be sent to you. Thank you again for trusting [place] with your care  - Thank you, [facility]    It is flu season:   >>> Best ways to protect herself from the flu: Receive the yearly flu vaccine, practice good hand hygiene washing with soap and also using hand sanitizer when available, eat a nutritious  meals, get adequate rest, hydrate appropriately       Please contact the office if your symptoms worsen or you have concerns that you are not improving.   Thank you for choosing [facility] for your healthcare, and for allowing [place] to partner with you on your healthcare journey. I am thankful to be able to provide care to you today.   Elisha Headland FNP-C    Sleep Apnea Sleep apnea affects breathing during sleep. It causes breathing to stop for a short time or to become shallow. It can also increase the risk of:  Heart attack.  Stroke.  Being very overweight (obese).  Diabetes.  Heart failure.  Irregular heartbeat. The goal of treatment is to help you breathe normally again. What are the causes? There are three kinds of sleep apnea:  Obstructive sleep apnea. This is caused by a blocked or collapsed airway.  Central sleep apnea. This happens when the brain does not send the right signals to the muscles that control breathing.  Mixed sleep apnea. This is a combination of obstructive and central sleep apnea. The most common cause of this condition is a collapsed or blocked airway. This can happen if:  Your throat muscles are too relaxed.  Your tongue and tonsils are too large.  You are overweight.  Your airway is too small. What increases the risk?  Being overweight.  Smoking.  Having a small airway.  Being older.  Being female.  Drinking alcohol.  Taking medicines to calm yourself (sedatives or tranquilizers).  Having family members with the condition. What are the signs or symptoms?  Trouble staying asleep.  Being sleepy or tired during the day.  Getting angry a lot.  Loud snoring.  Headaches in the morning.  Not being able to focus your mind (concentrate).  Forgetting things.  Less interest in sex.  Mood swings.  Personality changes.  Feelings of sadness (depression).  Waking up a lot during the night to pee (urinate).  Dry  mouth.  Sore throat. How is this diagnosed?  Your medical history.  A physical exam.  A test that is done when you are sleeping (sleep study). The test is most often done in a sleep lab but may also be done at home. How is this treated?   Sleeping on your side.  Using a medicine to get rid of mucus in your nose (decongestant).  Avoiding the use of alcohol, medicines to help you relax, or certain pain medicines (narcotics).  Losing weight, if needed.  Changing your diet.  Not smoking.  Using a machine to open your airway while you sleep, such as: ? An oral appliance. This is a mouthpiece that shifts your lower jaw forward. ? A CPAP device. This device blows air through a mask when you breathe out (exhale). ? An EPAP device. This has valves that you put in each nostril. ? A BPAP device. This device blows air through a mask when you breathe in (inhale) and breathe out.  Having surgery if other treatments do not work. It is important to get treatment for sleep apnea. Without treatment, it can lead to:  High blood pressure.  Coronary artery disease.  In men, not being able to have an erection (impotence).  Reduced thinking ability. Follow these instructions at home: Lifestyle  Make changes that your doctor recommends.  Eat a healthy diet.  Lose weight if needed.  Avoid alcohol, medicines to help you relax, and some pain medicines.  Do not use any products that contain nicotine or tobacco, such as cigarettes, e-cigarettes, and chewing tobacco. If you need help quitting, ask your doctor. General instructions  Take over-the-counter and prescription medicines only as told by your doctor.  If you were given a machine to use while you sleep, use it only as told by your doctor.  If you are having surgery, make sure to tell your doctor you have sleep apnea. You may need to bring your device with you.  Keep all follow-up visits as told by your doctor. This is  important. Contact a doctor if:  The machine that you were given to use during sleep bothers you or does not seem to be working.  You do not get better.  You get worse. Get help right away if:  Your chest hurts.  You have trouble breathing in enough air.  You have an uncomfortable feeling in your back, arms, or stomach.  You have trouble talking.  One side of your body feels weak.  A part of your face is hanging down. These symptoms may be an emergency. Do not wait to see if the symptoms will go away. Get medical help right away. Call your local emergency services (911 in the [place] Do not drive yourself to the hospital. Summary  This condition affects breathing during sleep.  The most common cause is a collapsed or blocked airway.  The goal of treatment is to help you breathe normally while you sleep. This information is not intended to replace advice given to you by your health care provider. Make sure you discuss any questions you have with your health care provider. Document Revised: 08/05/2018 Document Reviewed: 06/14/2018 Elsevier Patient Education  [place].

## 2020-10-30 NOTE — Assessment & Plan Note (Signed)
Plan: Regular flu vaccine today Recommend COVID-19 vaccination Recommend Pneumovax 23 as patient is a type II diabetic

## 2020-10-30 NOTE — Assessment & Plan Note (Signed)
Discussed oral appliance Discussed CPAP therapy Believe reasonable to proceed forward with CPAP therapy  Plan: Start CPAP therapy [employer] Mask of choice APAP setting 5-15

## 2020-10-30 NOTE — Progress Notes (Signed)
@Patient  ID: , female    DOB: October 09, 1986, 34 y.o.   MRN: [id number]  Chief Complaint  Patient presents with  . Follow-up    HST results, still not sleeping good    Referring provider: [id number], FNP  HPI:  34 year old female former smoker followed in our office for OSA  PMH: Polyarthralgia Smoker/ Smoking History: Former smoker Maintenance:  None  Pt of: Dr. 34  10/30/2020  - Visit   34 year old female former smoker followed in our office for suspected obstructive sleep apnea.  Patient was last seen in September/2021 as a sleep consult.  Patient completed a home sleep study in December/2021 that showed mild obstructive sleep apnea with an AHI of 7.7.  She is presenting today to discuss her results.  Patient does have issues with grinding teeth as well as some TMJ.  Unsure if she would be a great candidate for oral appliance.  She is interested in potentially considering CPAP therapy.  Patient does have a goal to lose between 10 to 15 pounds.  At that point she feels that she will be at a good baseline body weight which she is comfortable with.  She does have some eustachian tube dysfunction with nasal congestion today.  She is a history of ENT surgery with adenoid removal when she was younger.  Patient has received the first 2 COVID-19 vaccinations.  Is planning on receiving the booster  Patient would like to have the seasonal flu vaccine today.  She is a type II diabetic managed on metformin.  She does not have a Pneumovax 23 on file.  We will discuss this today.   Questionaires / Pulmonary Flowsheets:   ACT:  No flowsheet data found.  MMRC: No flowsheet data found.  Epworth:  Results of the Epworth flowsheet 07/12/2020  Sitting and reading 1  Watching TV 0  Sitting, inactive in a public place (e.g. a theatre or a meeting) 0  As a passenger in a car for an hour without a break 0  Lying down to rest in the afternoon when circumstances permit 1   Sitting and talking to someone 0  Sitting quietly after a lunch without alcohol 0  In a car, while stopped for a few minutes in traffic 0  Total score 2    Tests:   HST 10/10/20 >> AHI 7.7, SpO2 low 90%  FENO:  No results found for: NITRICOXIDE  PFT: No flowsheet data found.  WALK:  No flowsheet data found.  Imaging: No results found.  Lab Results:  CBC    Component Value Date/Time   WBC 10.9 (H) 07/25/2019 1551   RBC 5.07 07/25/2019 1551   HGB 14.7 07/25/2019 1551   HCT 43.7 07/25/2019 1551   PLT 376.0 07/25/2019 1551   MCV 86.2 07/25/2019 1551   MCH 28.1 07/01/2012 2052   MCHC 33.7 07/25/2019 1551   RDW 13.5 07/25/2019 1551   LYMPHSABS 2.6 07/25/2019 1551   MONOABS 0.6 07/25/2019 1551   EOSABS 0.2 07/25/2019 1551   BASOSABS 0.1 07/25/2019 1551    BMET    Component Value Date/Time   NA 139 07/25/2019 1551   K 3.9 07/25/2019 1551   CL 102 07/25/2019 1551   CO2 29 07/25/2019 1551   GLUCOSE 118 (H) 07/25/2019 1551   BUN 17 07/25/2019 1551   CREATININE 0.67 07/25/2019 1551   CALCIUM 10.2 07/25/2019 1551   CALCIUM 10.1 07/25/2019 1551   GFRNONAA >90 07/01/2012 2052   GFRAA >90 07/01/2012  2052    BNP No results found for: BNP  ProBNP No results found for: PROBNP  Specialty Problems      Pulmonary Problems   OSA (obstructive sleep apnea)    HST 10/10/20 >> AHI 7.7, SpO2 low 90%         No Known Allergies  Immunization History  Administered Date(s) Administered  . Influenza Split 08/02/2018  . Influenza,inj,Quad PF,6+ Mos 10/30/2020  . Influenza,inj,Quad PF,6-35 Mos 07/04/2019  . Influenza-Unspecified 08/02/2018, 08/25/2018, 07/04/2019  . PFIZER SARS-COV-2 Vaccination 01/18/2020, 02/12/2020    Past Medical History:  Diagnosis Date  . ADHD   . Asthma   . Depression   . DM type 2 (diabetes mellitus, type 2) (HCC)   . Hypertension   . Polyarthralgia   . Snapping hip syndrome   . Vitamin D deficiency     Tobacco History: Social  History   Tobacco Use  Smoking Status Former Smoker  Smokeless Tobacco Never Used   Counseling given: Not Answered   Continue to not smoke  Outpatient Encounter Medications as of 10/30/2020  Medication Sig  . ADDERALL XR 20 MG 24 hr capsule Take 20 mg by mouth every morning.  Marland Kitchen albuterol (VENTOLIN HFA) 108 (90 Base) MCG/ACT inhaler Inhale 2 puffs into the lungs as needed.  . Diclofenac Sodium 2 % SOLN Place 2 g onto the skin 2 (two) times daily.  . metFORMIN (GLUCOPHAGE) 500 MG tablet Take 500 mg by mouth 2 (two) times daily.  . sertraline (ZOLOFT) 25 MG tablet Take 25 mg by mouth daily.  . [DISCONTINUED] venlafaxine XR (EFFEXOR-XR) 37.5 MG 24 hr capsule    No facility-administered encounter medications on file as of 10/30/2020.     Review of Systems  Review of Systems  Constitutional: Negative for activity change, fatigue and fever.  HENT: Positive for congestion. Negative for sinus pressure, sinus pain and sore throat.   Respiratory: Negative for cough, shortness of breath and wheezing.   Cardiovascular: Negative for chest pain and palpitations.  Gastrointestinal: Negative for diarrhea, nausea and vomiting.  Musculoskeletal: Negative for arthralgias.  Neurological: Negative for dizziness.  Psychiatric/Behavioral: Negative for sleep disturbance. The patient is not nervous/anxious.      Physical Exam  BP 128/82 (BP Location: Left Arm, Cuff Size: Normal)   Pulse 98   Temp (!) 97.3 F (36.3 C) (Oral)   Ht 5\' 2"  (1.575 m)   Wt 172 lb 6.4 oz (78.2 kg)   SpO2 98%   BMI 31.53 kg/m   Wt Readings from Last 5 Encounters:  10/30/20 172 lb 6.4 oz (78.2 kg)  07/12/20 175 lb (79.4 kg)  11/01/19 154 lb (69.9 kg)  08/29/19 154 lb (69.9 kg)  07/25/19 153 lb (69.4 kg)    BMI Readings from Last 5 Encounters:  10/30/20 31.53 kg/m  07/12/20 32.01 kg/m  11/01/19 28.17 kg/m  08/29/19 28.17 kg/m  07/25/19 27.98 kg/m     Physical Exam Vitals and nursing note reviewed.   Constitutional:      General: She is not in acute distress.    Appearance: Normal appearance. She is normal weight.  HENT:     Head: Normocephalic and atraumatic.     Right Ear: Tympanic membrane, ear canal and external ear normal. There is no impacted cerumen.     Left Ear: Tympanic membrane, ear canal and external ear normal. There is no impacted cerumen.     Ears:     Comments: TM fullness without infection    Nose:  Nose normal. No congestion.     Mouth/Throat:     Mouth: Mucous membranes are moist.     Pharynx: Oropharynx is clear.     Comments: Mallampati 1 Eyes:     Pupils: Pupils are equal, round, and reactive to light.  Cardiovascular:     Rate and Rhythm: Normal rate and regular rhythm.     Pulses: Normal pulses.     Heart sounds: Normal heart sounds. No murmur heard.   Pulmonary:     Effort: Pulmonary effort is normal. No respiratory distress.     Breath sounds: Normal breath sounds. No decreased air movement. No decreased breath sounds, wheezing or rales.  Musculoskeletal:     Cervical back: Normal range of motion.  Skin:    General: Skin is warm and dry.     Capillary Refill: Capillary refill takes less than 2 seconds.  Neurological:     General: No focal deficit present.     Mental Status: She is alert and oriented to person, place, and time. Mental status is at baseline.     Gait: Gait normal.  Psychiatric:        Mood and Affect: Mood normal.        Behavior: Behavior normal.        Thought Content: Thought content normal.        Judgment: Judgment normal.       Assessment & Plan:   OSA (obstructive sleep apnea) Discussed oral appliance Discussed CPAP therapy Believe reasonable to proceed forward with CPAP therapy  Plan: Start CPAP therapy [employer] Mask of choice APAP setting 5-15  Healthcare maintenance Plan: Regular flu vaccine today Recommend COVID-19 vaccination Recommend Pneumovax 23 as patient is a type II diabetic    Return in  about 3 months (around 01/28/2021), or if symptoms worsen or fail to improve, for Follow up with Dr. Craige Cotta.   Coral Ceo, NP 10/30/2020   This appointment required 32 minutes of patient care (this includes precharting, chart review, review of results, face-to-face care, etc.).

## 2020-11-04 NOTE — Progress Notes (Signed)
Reviewed and agree with assessment/plan.   Daisa Stennis, MD [facility] 11/04/2020, 8:11 AM Pager:  [phone]  

## 2020-12-06 IMAGING — DX DG HIP (WITH OR WITHOUT PELVIS) 2-3V LEFT
3 series · 3 of 3 positions shown · non-contrast
Comparison: No recent.

CLINICAL DATA: Right hip area pain.  No known injury.

EXAM:
DG HIP (WITH OR WITHOUT PELVIS) 2-3V LEFT

[pelvis ap]
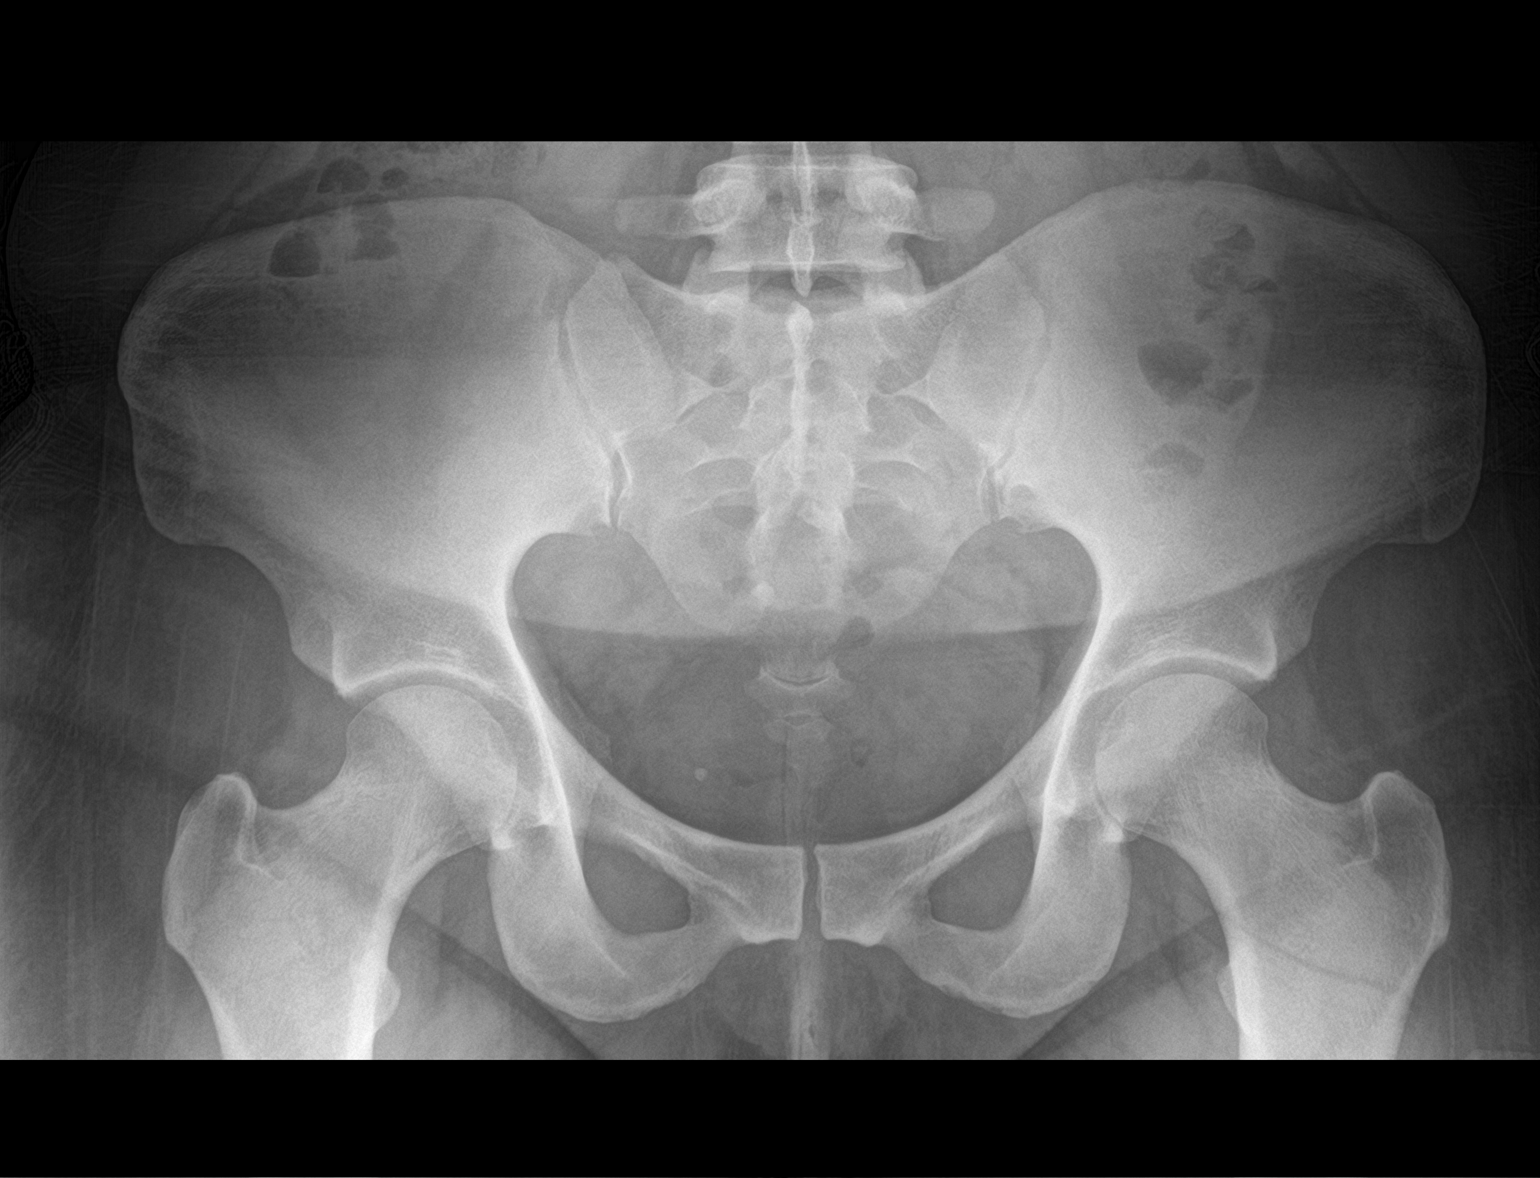

[hip ap]
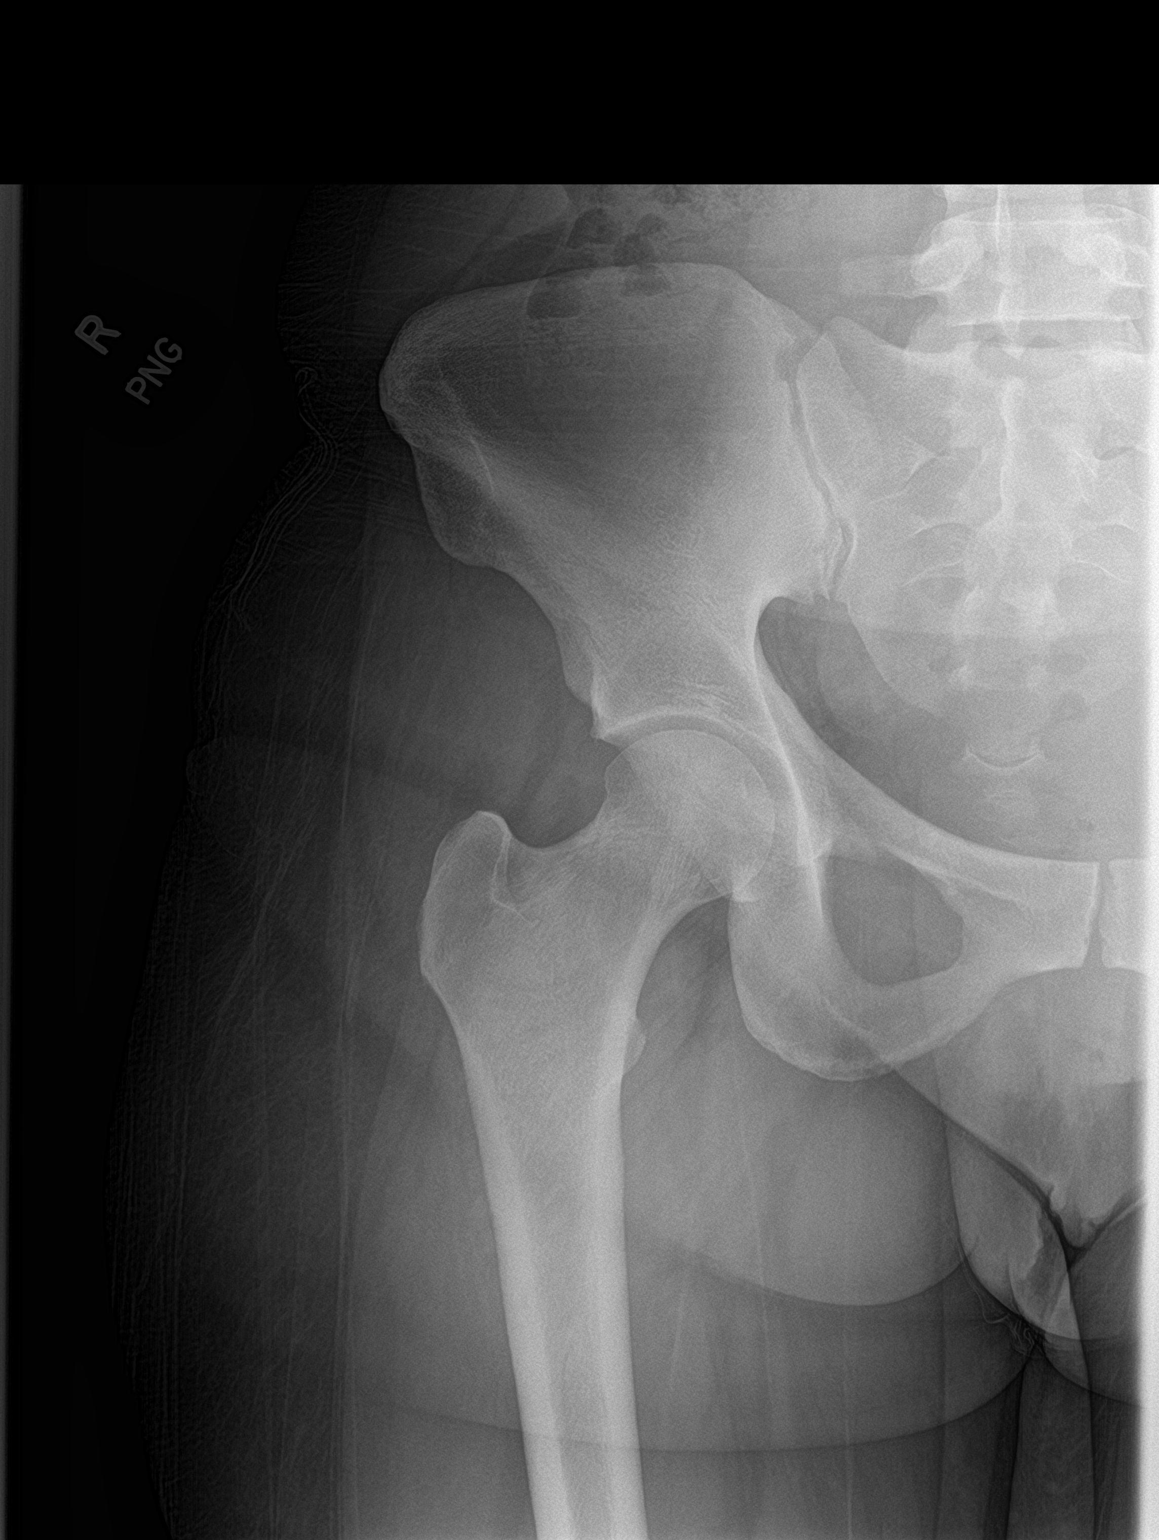

[hip lat]
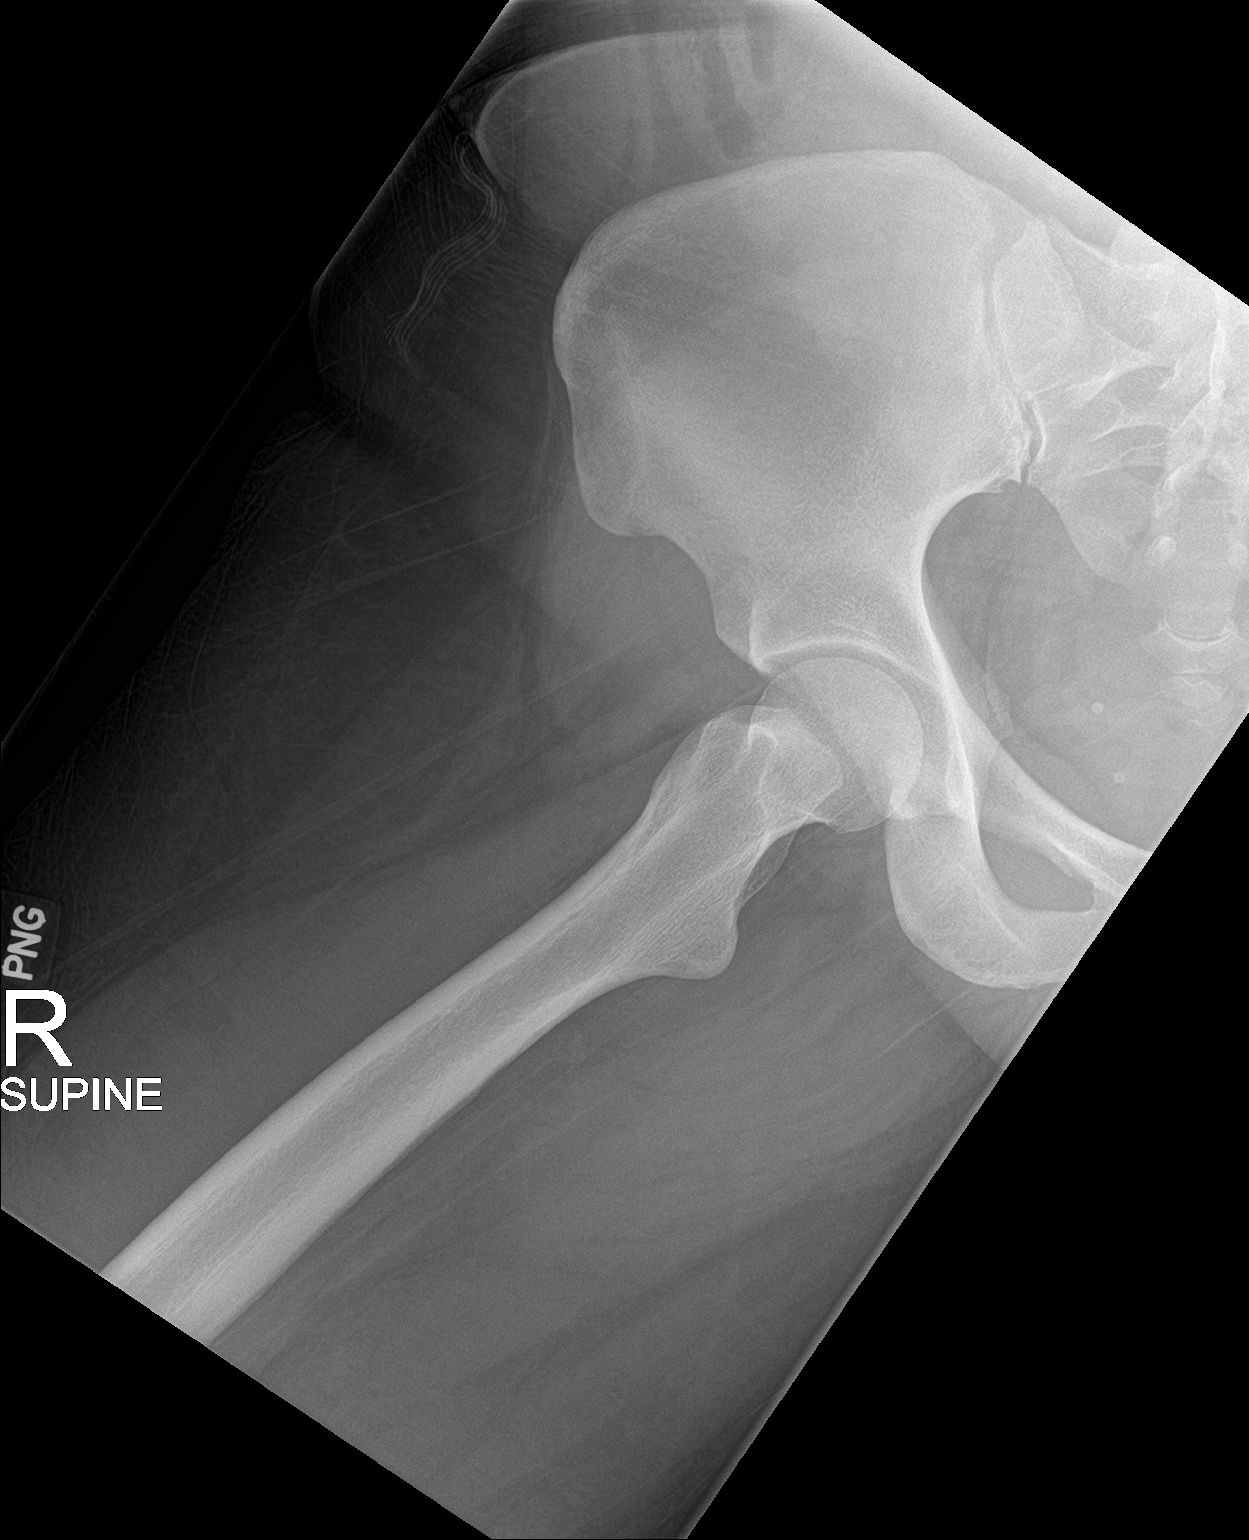

[3 of 3 positions shown; findings below may reference images not displayed]

FINDINGS: No acute bony or joint abnormality identified. No evidence of
fracture dislocation. Tiny pelvic calcifications consistent
phleboliths.
IMPRESSION: No acute abnormality.
